# Patient Record
Sex: Female | Born: 1970 | Race: White | Hispanic: No | Marital: Married | Smoking: Never smoker
Health system: Southern US, Community
[De-identification: ages and names within clinical notes are randomized; demographics above are authoritative.]

## PROBLEM LIST (undated history)

## (undated) DIAGNOSIS — I10 Essential (primary) hypertension: Secondary | ICD-10-CM

## (undated) DIAGNOSIS — E669 Obesity, unspecified: Secondary | ICD-10-CM

## (undated) DIAGNOSIS — N926 Irregular menstruation, unspecified: Secondary | ICD-10-CM

## (undated) HISTORY — DX: Irregular menstruation, unspecified: N92.6

## (undated) HISTORY — PX: NO PAST SURGERIES: SHX2092

## (undated) HISTORY — DX: Obesity, unspecified: E66.9

## (undated) HISTORY — PX: WISDOM TOOTH EXTRACTION: SHX21

## (undated) HISTORY — DX: Essential (primary) hypertension: I10

## (undated) HISTORY — PX: OVARIAN CYST REMOVAL: SHX89

---

## 1999-03-31 ENCOUNTER — Other Ambulatory Visit: Admission: RE | Admit: 1999-03-31 | Discharge: 1999-03-31 | Payer: Self-pay | Admitting: *Deleted

## 2014-06-09 ENCOUNTER — Other Ambulatory Visit: Payer: Self-pay | Admitting: Family Medicine

## 2014-06-09 ENCOUNTER — Other Ambulatory Visit (HOSPITAL_COMMUNITY)
Admission: RE | Admit: 2014-06-09 | Discharge: 2014-06-09 | Disposition: A | Discharge status: Home / Self Care | Payer: BC Managed Care – PPO | Source: Ambulatory Visit | Attending: Family Medicine | Admitting: Family Medicine

## 2014-06-09 DIAGNOSIS — Z124 Encounter for screening for malignant neoplasm of cervix: Secondary | ICD-10-CM | POA: Insufficient documentation

## 2014-06-09 DIAGNOSIS — Z1151 Encounter for screening for human papillomavirus (HPV): Secondary | ICD-10-CM | POA: Insufficient documentation

## 2014-06-11 LAB — CYTOLOGY - PAP

## 2015-06-03 ENCOUNTER — Other Ambulatory Visit: Payer: Self-pay

## 2015-06-03 DIAGNOSIS — Z1231 Encounter for screening mammogram for malignant neoplasm of breast: Secondary | ICD-10-CM

## 2015-06-08 ENCOUNTER — Ambulatory Visit: Payer: BC Managed Care – PPO

## 2015-06-11 ENCOUNTER — Ambulatory Visit
Admission: RE | Admit: 2015-06-11 | Discharge: 2015-06-11 | Disposition: A | Discharge status: Home / Self Care | Payer: BC Managed Care – PPO | Source: Ambulatory Visit

## 2015-06-11 DIAGNOSIS — Z1231 Encounter for screening mammogram for malignant neoplasm of breast: Secondary | ICD-10-CM

## 2016-06-07 ENCOUNTER — Other Ambulatory Visit: Payer: Self-pay | Admitting: Family Medicine

## 2016-06-07 DIAGNOSIS — Z1231 Encounter for screening mammogram for malignant neoplasm of breast: Secondary | ICD-10-CM

## 2016-06-09 ENCOUNTER — Ambulatory Visit
Admission: RE | Admit: 2016-06-09 | Discharge: 2016-06-09 | Disposition: A | Discharge status: Home / Self Care | Payer: BC Managed Care – PPO | Source: Ambulatory Visit | Attending: Family Medicine | Admitting: Family Medicine

## 2016-06-09 DIAGNOSIS — Z1231 Encounter for screening mammogram for malignant neoplasm of breast: Secondary | ICD-10-CM

## 2016-06-13 ENCOUNTER — Ambulatory Visit: Payer: BC Managed Care – PPO

## 2017-05-10 ENCOUNTER — Other Ambulatory Visit: Payer: Self-pay | Admitting: Family Medicine

## 2017-05-10 DIAGNOSIS — Z1231 Encounter for screening mammogram for malignant neoplasm of breast: Secondary | ICD-10-CM

## 2017-06-11 ENCOUNTER — Ambulatory Visit: Payer: BC Managed Care – PPO

## 2017-06-18 ENCOUNTER — Ambulatory Visit
Admission: RE | Admit: 2017-06-18 | Discharge: 2017-06-18 | Disposition: A | Discharge status: Home / Self Care | Payer: BC Managed Care – PPO | Source: Ambulatory Visit | Attending: Family Medicine | Admitting: Family Medicine

## 2017-06-18 DIAGNOSIS — Z1231 Encounter for screening mammogram for malignant neoplasm of breast: Secondary | ICD-10-CM

## 2018-05-10 ENCOUNTER — Other Ambulatory Visit: Payer: Self-pay | Admitting: Family Medicine

## 2018-05-10 DIAGNOSIS — Z1231 Encounter for screening mammogram for malignant neoplasm of breast: Secondary | ICD-10-CM

## 2018-06-19 ENCOUNTER — Ambulatory Visit
Admission: RE | Admit: 2018-06-19 | Discharge: 2018-06-19 | Disposition: A | Discharge status: Home / Self Care | Payer: BC Managed Care – PPO | Source: Ambulatory Visit | Attending: Family Medicine | Admitting: Family Medicine

## 2018-06-19 DIAGNOSIS — Z1231 Encounter for screening mammogram for malignant neoplasm of breast: Secondary | ICD-10-CM

## 2019-01-21 ENCOUNTER — Ambulatory Visit (INDEPENDENT_AMBULATORY_CARE_PROVIDER_SITE_OTHER): Payer: BC Managed Care – PPO | Admitting: Physician Assistant

## 2019-01-21 ENCOUNTER — Encounter: Payer: Self-pay | Admitting: Physician Assistant

## 2019-01-21 VITALS — BP 131/89 | HR 89 | Resp 14 | Ht 65.0 in | Wt 244.0 lb

## 2019-01-21 DIAGNOSIS — I10 Essential (primary) hypertension: Secondary | ICD-10-CM

## 2019-01-21 DIAGNOSIS — N970 Female infertility associated with anovulation: Secondary | ICD-10-CM

## 2019-01-21 DIAGNOSIS — Z131 Encounter for screening for diabetes mellitus: Secondary | ICD-10-CM

## 2019-01-21 DIAGNOSIS — Z1329 Encounter for screening for other suspected endocrine disorder: Secondary | ICD-10-CM

## 2019-01-21 DIAGNOSIS — Z6841 Body Mass Index (BMI) 40.0 and over, adult: Secondary | ICD-10-CM | POA: Insufficient documentation

## 2019-01-21 DIAGNOSIS — Z13220 Encounter for screening for lipoid disorders: Secondary | ICD-10-CM | POA: Diagnosis not present

## 2019-01-21 DIAGNOSIS — E66812 Obesity, class 2: Secondary | ICD-10-CM

## 2019-01-21 DIAGNOSIS — N926 Irregular menstruation, unspecified: Secondary | ICD-10-CM

## 2019-01-21 NOTE — Patient Instructions (Addendum)
Request your mediation refills through your pharmacy. Have them contact our office  Due in July for Pap smear  For your blood pressure: - Goal <130/80 (Ideally 120's/70's) - take your blood pressure medication in the morning (unless instructed differently) - monitor and log blood pressures at home - check around the same time each day in a relaxed setting - Limit salt to <2500 mg/day - Follow DASH (Dietary Approach to Stopping Hypertension) eating plan - Try to get at least 150 minutes of aerobic exercise per week - Aim to go on a brisk walk 30 minutes per day at least 5 days per week. If you're not active, gradually increase how long you walk by 5 minutes each week - limit alcohol: 2 standard drinks per day for men and 1 per day for women - avoid tobacco/nicotine products. Consider smoking cessation if you smoke - weight loss: 7% of current body weight can reduce your blood pressure by 5-10 points - follow-up at least every 6 months for your blood pressure. Follow-up sooner if your BP is not controlled   Diet for Polycystic Ovary Syndrome Polycystic ovary syndrome (PCOS) is a disorder of the chemicals (hormones) that regulate a woman's reproductive system, including monthly periods (menstruation). The condition causes important hormones to be out of balance. PCOS can:  Stop your periods or make them irregular.  Cause cysts to develop on your ovaries.  Make it difficult to get pregnant.  Stop your body from responding to the effects of insulin (insulin resistance). Insulin resistance can lead to obesity and diabetes. Changing what you eat can help you manage PCOS and improve your health. Following a balanced diet can help you lose weight and improve the way that your body uses insulin. What are tips for following this plan?  Follow a balanced diet for meals and snacks. Eat breakfast, lunch, dinner, and one or two snacks every day.  Include protein in each meal and snack.  Choose  whole grains instead of products that are made with refined flour.  Eat a variety of foods.  Exercise regularly as told by your health care provider. Aim to do 30 or more minutes of exercise on most days of the week.  If you are overweight or obese: ? Pay attention to how many calories you eat. Cutting down on calories can help you lose weight. ? Work with your health care provider or a diet and nutrition specialist (dietitian) to figure out how many calories you need each day. What foods can I eat?  Fruits Include a variety of colors and types. All fruits are helpful for PCOS. Vegetables Include a variety of colors and types. All vegetables are helpful for PCOS. Grains Whole grains, such as whole wheat. Whole-grain breads, crackers, cereals, and pasta. Unsweetened oatmeal, bulgur, barley, quinoa, and brown rice. Tortillas made from corn or whole-wheat flour. Meats and other proteins Low-fat (lean) proteins, such as fish, chicken, beans, eggs, and tofu. Dairy Low-fat dairy products, such as skim milk, cheese sticks, and yogurt. Beverages Low-fat or fat-free drinks, such as water, low-fat milk, sugar-free drinks, and small amounts of 100% fruit juice. Seasonings and condiments Ketchup. Mustard. Barbecue sauce. Relish. Low-fat or fat-free mayonnaise. Fats and oils Olive oil or canola oil. Walnuts and almonds. The items listed above may not be a complete list of recommended foods and beverages. Contact a dietitian for more options. What foods are not recommended? Foods that are high in calories or fat. Fried foods. Sweets. Products that are made  from refined white flour, including white bread, pastries, white rice, and pasta. The items listed above may not be a complete list of foods and beverages to avoid. Contact a dietitian for more information. Summary  PCOS is a hormonal imbalance that affects a woman's reproductive system.  You can help to manage your PCOS by exercising  regularly and eating a healthy, varied diet of vegetables, fruit, whole grains, low-fat (lean) protein, and low-fat dairy products.  Changing what you eat can improve the way that your body uses insulin, help your hormones reach normal levels, and help you lose weight. This information is not intended to replace advice given to you by your health care provider. Make sure you discuss any questions you have with your health care provider. Document Released: 03/13/2016 Document Revised: 09/24/2017 Document Reviewed: 09/24/2017 Elsevier Interactive Patient Education  2019 [employer]     Dysfunctional Uterine Bleeding Dysfunctional uterine bleeding is abnormal bleeding from the uterus. Dysfunctional uterine bleeding includes:  A menstrual period that comes earlier or later than usual.  A menstrual period that is lighter or heavier than usual, or has large blood clots.  Vaginal bleeding between menstrual periods.  Skipping one or more menstrual periods.  Vaginal bleeding after sex.  Vaginal bleeding after menopause. Follow these instructions at home: Eating and drinking   Eat well-balanced meals. Include foods that are high in iron, such as liver, meat, shellfish, green leafy vegetables, and eggs.  To prevent or treat constipation, your health care provider may recommend that you: ? Drink enough fluid to keep your urine pale yellow. ? Take over-the-counter or prescription medicines. ? Eat foods that are high in fiber, such as beans, whole grains, and fresh fruits and vegetables. ? Limit foods that are high in fat and processed sugars, such as fried or sweet foods. Medicines  Take over-the-counter and prescription medicines only as told by your health care provider.  Do not change medicines without talking with your health care provider.  Aspirin or medicines that contain aspirin may make the bleeding worse. Do not take those medicines: ? During the week before your menstrual  period. ? During your menstrual period.  If you were prescribed iron pills, take them as told by your health care provider. Iron pills help to replace iron that your body loses because of this condition. Activity  If you need to change your sanitary pad or tampon more than one time every 2 hours: ? Lie in bed with your feet raised (elevated). ? Place a cold pack on your lower abdomen. ? Rest as much as possible until the bleeding stops or slows down.  Do not try to lose weight until the bleeding has stopped and your blood iron level is back to normal. General instructions   For two months, write down: ? When your menstrual period starts. ? When your menstrual period ends. ? When any abnormal vaginal bleeding occurs. ? What problems you notice.  Keep all follow up visits as told by your health care provider. This is important. Contact a health care provider if you:  Feel light-headed or weak.  Have nausea and vomiting.  Cannot eat or drink without vomiting.  Feel dizzy or have diarrhea while you are taking medicines.  Are taking birth control pills or hormones, and you want to change them or stop taking them. Get help right away if:  You develop a fever or chills.  You need to change your sanitary pad or tampon more than one time  per hour.  Your vaginal bleeding becomes heavier, or your flow contains clots more often.  You develop pain in your abdomen.  You lose consciousness.  You develop a rash. Summary  Dysfunctional uterine bleeding is abnormal bleeding from the uterus.  It includes menstrual bleeding of abnormal duration, volume, or regularity.  Bleeding after sex and after menopause are also considered dysfunctional uterine bleeding. This information is not intended to replace advice given to you by your health care provider. Make sure you discuss any questions you have with your health care provider. Document Released: 11/17/2000 Document Revised:  05/01/2018 Document Reviewed: 05/01/2018 Elsevier Interactive Patient Education  2019 [employer].

## 2019-01-21 NOTE — Progress Notes (Signed)
HPI:                                                                Alison Fuentes is a 48 y.o. female who presents to [facility]: [facility] today to establish care  HTN: taking Losartan 50 mg daily. Compliant with medications. Does not check BP's at home. Denies vision change, headache, chest pain with exertion, orthopnea, lightheadedness, syncope and edema. Risk factors include: obesity, family hx  Menses: reports she goes months without a menstrual cycle. LMP was 01/17/19 but prior to this LMP was August 2019. She states this began in her 36's. Cycles last for 1-2 days. No dysmenorrhea or menorrhagia. She is not sexually active. Denies hirsutism.  Depression screen [facility] 2/9 01/21/2019  Decreased Interest 0  Down, Depressed, Hopeless 0  PHQ - 2 Score 0    No flowsheet data found.    Past Medical History:  Diagnosis Date  . Hypertension   . Irregular menses   . Obesity    Past Surgical History:  Procedure Laterality Date  . NO PAST SURGERIES     Social History   Tobacco Use  . Smoking status: Never Smoker  . Smokeless tobacco: Never Used  Substance Use Topics  . Alcohol use: Not Currently    Comment: very rarely   family history includes Atrial fibrillation in her father; Fibromyalgia in her mother; Heart attack in her paternal uncle; Heart disease in her maternal grandmother; Hyperlipidemia in her maternal aunt and mother.    ROS: negative except as noted in the HPI  Medications: Current Outpatient Medications  Medication Sig Dispense Refill  . losartan (COZAAR) 50 MG tablet      No current facility-administered medications for this visit.    Allergies  Allergen Reactions  . Codeine Nausea And Vomiting       Objective:  BP 131/89   Pulse 89   Resp 14   Ht 5' 5"  (1.651 m)   Wt 244 lb (110.7 kg)   LMP 01/17/2019   BMI 40.60 kg/m  Gen:  alert, not ill-appearing, no distress, appropriate for age, obese  female HEENT: head normocephalic without obvious abnormality, conjunctiva and cornea clear, trachea midline Pulm: Normal work of breathing, normal phonation, poor air movement and poor effort, no wheezes, rales or rhonchi CV: Normal rate, regular rhythm, s1 and s2 distinct, no murmurs, clicks or rubs  Neuro: alert and oriented x 3, no tremor MSK: extremities atraumatic, normal gait and station, no peripheral edema Skin: intact, facial rosacea present Psych: well-groomed, cooperative, good eye contact, euthymic mood, affect mood-congruent, speech is articulate, and thought processes clear and goal-directed  No results found for: CREATININE, BUN, NA, K, CL, CO2 No results found for: ALT, AST, GGT, ALKPHOS, BILITOT    No results found for this or any previous visit (from the past 72 hour(s)). No results found.    Assessment and Plan: 48 y.o. female with   .Natiya was seen today for establish care.  Diagnoses and all orders for this visit:  Hypertension goal BP (blood pressure) < 130/80 -     COMPLETE METABOLIC PANEL WITH GFR -     Hemoglobin A1c -     Lipid Panel w/reflex Direct LDL -  TSH + free T4  Anovulatory (dysfunctional uterine) bleeding -     CBC -     TSH + free T4 -     FSH/LH -     Testosterone , Free and Total -     Prolactin -     Estradiol -     US PELVIC COMPLETE WITH TRANSVAGINAL; Future  Class 2 severe obesity due to excess calories with serious comorbidity in adult, unspecified BMI (HCC) -     Hemoglobin A1c -     Lipid Panel w/reflex Direct LDL  Screening for lipid disorders -     Lipid Panel w/reflex Direct LDL  Irregular menses -     CBC -     TSH + free T4 -     FSH/LH -     Testosterone , Free and Total -     Prolactin -     Estradiol -     US PELVIC COMPLETE WITH TRANSVAGINAL; Future  Screening for thyroid disorder -     TSH + free T4  Screening for diabetes mellitus -     Hemoglobin A1c   - Personally reviewed PMH, PSH, PFH,  medications, allergies, HM - Age-appropriate cancer screening: Pap UTD, NILM, HPV neg, due 06/2019; mammogram UTD due 06/2019 - Influenza UTD - Tdap declined - PHQ2 negative  HTN BP out of range. Patient reports missing her dose of Losartan today Counseled on therapeutic lifestyle changes including weight loss Renal function pending  AUB Hx consistent with anovulatory bleeding. DDx includes PCOS, thyroid disease, prolactinoma/empty sella, Cushings, premature ovarian failure FSH, LH, estradiol, testosterone, prolactin and TSH pending Pelv/TV ordered to assess ovaries  Patient education and anticipatory guidance given Patient agrees with treatment plan Follow-up pending lab and imaging results  Darlyne Russian PA-C

## 2019-01-23 ENCOUNTER — Encounter: Payer: Self-pay | Admitting: Physician Assistant

## 2019-01-23 DIAGNOSIS — Z780 Asymptomatic menopausal state: Secondary | ICD-10-CM | POA: Insufficient documentation

## 2019-01-24 LAB — LIPID PANEL W/REFLEX DIRECT LDL
Cholesterol: 187 mg/dL (ref <200)
HDL: 39 mg/dL — ABNORMAL LOW (ref >=50)
LDL Cholesterol (Calc): 128 mg/dL (calc) — ABNORMAL HIGH
Non-HDL Cholesterol (Calc): 148 mg/dL (calc) — ABNORMAL HIGH (ref <130)
TRIGLYCERIDES: 102 mg/dL (ref <150)
Total CHOL/HDL Ratio: 4.8 (calc) (ref <5.0)

## 2019-01-24 LAB — CBC
HCT: 41.9 % (ref 35.0–45.0)
Hemoglobin: 14.3 g/dL (ref 11.7–15.5)
MCH: 30 pg (ref 27.0–33.0)
MCHC: 34.1 g/dL (ref 32.0–36.0)
MCV: 87.8 fL (ref 80.0–100.0)
MPV: 10.8 fL (ref 7.5–12.5)
PLATELETS: 265 10*3/uL (ref 140–400)
RBC: 4.77 10*6/uL (ref 3.80–5.10)
RDW: 13.2 % (ref 11.0–15.0)
WBC: 6.9 10*3/uL (ref 3.8–10.8)

## 2019-01-24 LAB — HEMOGLOBIN A1C
EAG (MMOL/L): 5.4 (calc)
Hgb A1c MFr Bld: 5 % of total Hgb (ref <5.7)
MEAN PLASMA GLUCOSE: 97 (calc)

## 2019-01-24 LAB — COMPLETE METABOLIC PANEL WITH GFR
AG RATIO: 1.5 (calc) (ref 1.0–2.5)
ALT: 15 U/L (ref 6–29)
AST: 20 U/L (ref 10–35)
Albumin: 4 g/dL (ref 3.6–5.1)
Alkaline phosphatase (APISO): 67 U/L (ref 31–125)
BUN: 12 mg/dL (ref 7–25)
CALCIUM: 9.2 mg/dL (ref 8.6–10.2)
CHLORIDE: 107 mmol/L (ref 98–110)
CO2: 24 mmol/L (ref 20–32)
Creat: 0.57 mg/dL (ref 0.50–1.10)
GFR, Est African American: 126 mL/min/{1.73_m2} (ref >=60)
GFR, Est Non African American: 109 mL/min/{1.73_m2} (ref >=60)
Globulin: 2.7 g/dL (calc) (ref 1.9–3.7)
Glucose, Bld: 76 mg/dL (ref 65–139)
POTASSIUM: 4.1 mmol/L (ref 3.5–5.3)
Sodium: 140 mmol/L (ref 135–146)
Total Bilirubin: 0.5 mg/dL (ref 0.2–1.2)
Total Protein: 6.7 g/dL (ref 6.1–8.1)

## 2019-01-24 LAB — TESTOSTERONE, FREE & TOTAL
Free Testosterone: 2.5 pg/mL (ref 0.1–6.4)
Testosterone, Total, LC-MS-MS: 28 ng/dL (ref 2–45)

## 2019-01-24 LAB — FSH/LH
FSH: 39.7 m[IU]/mL
LH: 11.3 m[IU]/mL

## 2019-01-24 LAB — PROLACTIN: PROLACTIN: 4.7 ng/mL

## 2019-01-24 LAB — ESTRADIOL: ESTRADIOL: 28 pg/mL

## 2019-01-24 LAB — TSH+FREE T4: TSH W/REFLEX TO FT4: 2.26 m[IU]/L

## 2019-01-28 ENCOUNTER — Other Ambulatory Visit: Payer: BC Managed Care – PPO

## 2019-02-05 ENCOUNTER — Ambulatory Visit (INDEPENDENT_AMBULATORY_CARE_PROVIDER_SITE_OTHER): Payer: BC Managed Care – PPO

## 2019-02-05 DIAGNOSIS — D252 Subserosal leiomyoma of uterus: Secondary | ICD-10-CM | POA: Diagnosis not present

## 2019-02-05 DIAGNOSIS — N970 Female infertility associated with anovulation: Secondary | ICD-10-CM

## 2019-02-05 DIAGNOSIS — N926 Irregular menstruation, unspecified: Secondary | ICD-10-CM

## 2019-02-06 ENCOUNTER — Other Ambulatory Visit: Payer: Self-pay | Admitting: Physician Assistant

## 2019-02-06 DIAGNOSIS — D259 Leiomyoma of uterus, unspecified: Secondary | ICD-10-CM

## 2019-02-27 ENCOUNTER — Encounter: Payer: BC Managed Care – PPO | Admitting: Obstetrics and Gynecology

## 2019-05-26 ENCOUNTER — Other Ambulatory Visit: Payer: Self-pay | Admitting: Physician Assistant

## 2019-05-26 DIAGNOSIS — Z1231 Encounter for screening mammogram for malignant neoplasm of breast: Secondary | ICD-10-CM

## 2019-06-05 ENCOUNTER — Encounter: Payer: Self-pay | Admitting: Family Medicine

## 2019-06-05 ENCOUNTER — Ambulatory Visit: Payer: BC Managed Care – PPO | Admitting: Family Medicine

## 2019-06-05 ENCOUNTER — Other Ambulatory Visit: Payer: Self-pay

## 2019-06-05 DIAGNOSIS — D252 Subserosal leiomyoma of uterus: Secondary | ICD-10-CM | POA: Diagnosis not present

## 2019-06-05 DIAGNOSIS — D259 Leiomyoma of uterus, unspecified: Secondary | ICD-10-CM | POA: Insufficient documentation

## 2019-06-05 DIAGNOSIS — Z780 Asymptomatic menopausal state: Secondary | ICD-10-CM

## 2019-06-05 NOTE — Assessment & Plan Note (Addendum)
Reviewed with patient. Irregular cycles related to this. She is w/o hot flashes or other issues surrounding climacteric. Definition of 1 year without cycle reviewed.

## 2019-06-05 NOTE — Patient Instructions (Signed)
Menopause Menopause is the normal time of life when menstrual periods stop completely. It is usually confirmed by 12 months without a menstrual period. The transition to menopause (perimenopause) most often happens between the ages of 45 and 55. During perimenopause, hormone levels change in your body, which can cause symptoms and affect your health. Menopause may increase your risk for:  Loss of bone (osteoporosis), which causes bone breaks (fractures).  Depression.  Hardening and narrowing of the arteries (atherosclerosis), which can cause heart attacks and strokes. What are the causes? This condition is usually caused by a natural change in hormone levels that happens as you get older. The condition may also be caused by surgery to remove both ovaries (bilateral oophorectomy). What increases the risk? This condition is more likely to start at an earlier age if you have certain medical conditions or treatments, including:  A tumor of the pituitary gland in the brain.  A disease that affects the ovaries and hormone production.  Radiation treatment for cancer.  Certain cancer treatments, such as chemotherapy or hormone (anti-estrogen) therapy.  Heavy smoking and excessive alcohol use.  Family history of early menopause. This condition is also more likely to develop earlier in women who are very thin. What are the signs or symptoms? Symptoms of this condition include:  Hot flashes.  Irregular menstrual periods.  Night sweats.  Changes in feelings about sex. This could be a decrease in sex drive or an increased comfort around your sexuality.  Vaginal dryness and thinning of the vaginal walls. This may cause painful intercourse.  Dryness of the skin and development of wrinkles.  Headaches.  Problems sleeping (insomnia).  Mood swings or irritability.  Memory problems.  Weight gain.  Hair growth on the face and chest.  Bladder infections or problems with urinating. How  is this diagnosed? This condition is diagnosed based on your medical history, a physical exam, your age, your menstrual history, and your symptoms. Hormone tests may also be done. How is this treated? In some cases, no treatment is needed. You and your health care provider should make a decision together about whether treatment is necessary. Treatment will be based on your individual condition and preferences. Treatment for this condition focuses on managing symptoms. Treatment may include:  Menopausal hormone therapy (MHT).  Medicines to treat specific symptoms or complications.  Acupuncture.  Vitamin or herbal supplements. Before starting treatment, make sure to let your health care provider know if you have a personal or family history of:  Heart disease.  Breast cancer.  Blood clots.  Diabetes.  Osteoporosis. Follow these instructions at home: Lifestyle  Do not use any products that contain nicotine or tobacco, such as cigarettes and e-cigarettes. If you need help quitting, ask your health care provider.  Get at least 30 minutes of physical activity on 5 or more days each week.  Avoid alcoholic and caffeinated beverages, as well as spicy foods. This may help prevent hot flashes.  Get 7-8 hours of sleep each night.  If you have hot flashes, try: ? Dressing in layers. ? Avoiding things that may trigger hot flashes, such as spicy food, warm places, or stress. ? Taking slow, deep breaths when a hot flash starts. ? Keeping a fan in your home and office.  Find ways to manage stress, such as deep breathing, meditation, or journaling.  Consider going to group therapy with other women who are having menopause symptoms. Ask your health care provider about recommended group therapy meetings. Eating and   drinking  Eat a healthy, balanced diet that contains whole grains, lean protein, low-fat dairy, and plenty of fruits and vegetables.  Your health care provider may recommend  adding more soy to your diet. Foods that contain soy include tofu, tempeh, and soy milk.  Eat plenty of foods that contain calcium and vitamin D for bone health. Items that are rich in calcium include low-fat milk, yogurt, beans, almonds, sardines, broccoli, and kale. Medicines  Take over-the-counter and prescription medicines only as told by your health care provider.  Talk with your health care provider before starting any herbal supplements. If prescribed, take vitamins and supplements as told by your health care provider. These may include: ? Calcium. Women age 51 and older should get 1,200 mg (milligrams) of calcium every day. ? Vitamin D. Women need 600-800 International Units of vitamin D each day. ? Vitamins B12 and B6. Aim for 50 micrograms of B12 and 1.5 mg of B6 each day. General instructions  Keep track of your menstrual periods, including: ? When they occur. ? How heavy they are and how long they last. ? How much time passes between periods.  Keep track of your symptoms, noting when they start, how often you have them, and how long they last.  Use vaginal lubricants or moisturizers to help with vaginal dryness and improve comfort during sex.  Keep all follow-up visits as told by your health care provider. This is important. This includes any group therapy or counseling. Contact a health care provider if:  You are still having menstrual periods after age 55.  You have pain during sex.  You have not had a period for 12 months and you develop vaginal bleeding. Get help right away if:  You have: ? Severe depression. ? Excessive vaginal bleeding. ? Pain when you urinate. ? A fast or irregular heart beat (palpitations). ? Severe headaches. ? Abdomen (abdominal) pain or severe indigestion.  You fell and you think you have a broken bone.  You develop leg or chest pain.  You develop vision problems.  You feel a lump in your breast. Summary  Menopause is the normal  time of life when menstrual periods stop completely. It is usually confirmed by 12 months without a menstrual period.  The transition to menopause (perimenopause) most often happens between the ages of 45 and 55.  Symptoms can be managed through medicines, lifestyle changes, and complementary therapies such as acupuncture.  Eat a balanced diet that is rich in nutrients to promote bone health and heart health and to manage symptoms during menopause. This information is not intended to replace advice given to you by your health care provider. Make sure you discuss any questions you have with your health care provider. Document Released: 02/10/2004 Document Revised: 11/02/2017 Document Reviewed: 12/23/2016 Elsevier Patient Education  2020 Elsevier Inc.  

## 2019-06-05 NOTE — Assessment & Plan Note (Signed)
Given location and lack of symptoms, should continue to resorb during menopause as these are mostly hormonally responsive. Images reviewed with patient and detailed information given.

## 2019-06-05 NOTE — Progress Notes (Signed)
   Subjective:    Patient ID: Alison Fuentes is a 48 y.o. female presenting with Fibroids  on 06/05/2019  HPI: New referral today for fibroids. Seen in PCP office and noted to have irregular cycles. W/u included labs and u/s. She is here for f/u. Reports cycles are irregular. Coming every few months. Prior to this were monthly until mid-forties. Denies hot flashes. Works at [employer]. She is a G0. Denies pelvic pain or heavy menstrual bleeding. LMP was in may of 2020. Reports normal pap smears with [facility]--need records.  Review of Systems  Constitutional: Negative for chills and fever.  Respiratory: Negative for shortness of breath.   Cardiovascular: Negative for chest pain.  Gastrointestinal: Negative for abdominal pain, nausea and vomiting.  Genitourinary: Negative for dysuria.  Skin: Negative for rash.      Objective:    Resp 16   Ht 5' 5"  (1.651 m)   BMI 40.60 kg/m  Physical Exam Constitutional:      General: She is not in acute distress.    Appearance: She is well-developed.  HENT:     Head: Normocephalic and atraumatic.  Eyes:     General: No scleral icterus. Neck:     Musculoskeletal: Neck supple.  Cardiovascular:     Rate and Rhythm: Normal rate.  Pulmonary:     Effort: Pulmonary effort is normal.  Abdominal:     Palpations: Abdomen is soft.  Skin:    General: Skin is warm and dry.  Neurological:     Mental Status: She is alert and oriented to person, place, and time.    FSH is 39.7, LH is 11.3, nml TSH U/s shows 8 cm uterus with small 2 cm subserosal fibroid. Endometrial stripe is 2 cm. Ovaries are not seen      Assessment & Plan:   Problem List Items Addressed This Visit      Unprioritized   Postmenopausal    Reviewed with patient. Irregular cycles related to this. She is w/o hot flashes or other issues surrounding climacteric. Definition of 1 year without cycle reviewed.      Fibroid uterus    Given location and lack of symptoms, should  continue to resorb during menopause as these are mostly hormonally responsive. Images reviewed with patient and detailed information given.         Total face-to-face time with patient: 20 minutes. Over 50% of encounter was spent on counseling and coordination of care. Return if symptoms worsen or fail to improve or when due for pap.  Donnamae Jude 06/05/2019 1:14 PM

## 2019-06-24 ENCOUNTER — Encounter: Payer: BC Managed Care – PPO | Admitting: Physician Assistant

## 2019-07-01 ENCOUNTER — Other Ambulatory Visit: Payer: Self-pay

## 2019-07-01 ENCOUNTER — Ambulatory Visit (INDEPENDENT_AMBULATORY_CARE_PROVIDER_SITE_OTHER): Payer: BC Managed Care – PPO | Admitting: Physician Assistant

## 2019-07-01 ENCOUNTER — Other Ambulatory Visit (HOSPITAL_COMMUNITY)
Admission: RE | Admit: 2019-07-01 | Discharge: 2019-07-01 | Disposition: A | Discharge status: Home / Self Care | Payer: BC Managed Care – PPO | Source: Ambulatory Visit | Attending: Physician Assistant | Admitting: Physician Assistant

## 2019-07-01 ENCOUNTER — Encounter: Payer: Self-pay | Admitting: Physician Assistant

## 2019-07-01 VITALS — BP 128/87 | HR 84 | Temp 98.0°F | Wt 252.0 lb

## 2019-07-01 DIAGNOSIS — N898 Other specified noninflammatory disorders of vagina: Secondary | ICD-10-CM | POA: Insufficient documentation

## 2019-07-01 DIAGNOSIS — Z1211 Encounter for screening for malignant neoplasm of colon: Secondary | ICD-10-CM

## 2019-07-01 DIAGNOSIS — I10 Essential (primary) hypertension: Secondary | ICD-10-CM

## 2019-07-01 DIAGNOSIS — Z124 Encounter for screening for malignant neoplasm of cervix: Secondary | ICD-10-CM | POA: Insufficient documentation

## 2019-07-01 DIAGNOSIS — N939 Abnormal uterine and vaginal bleeding, unspecified: Secondary | ICD-10-CM

## 2019-07-01 DIAGNOSIS — N952 Postmenopausal atrophic vaginitis: Secondary | ICD-10-CM | POA: Diagnosis not present

## 2019-07-01 DIAGNOSIS — Z6841 Body Mass Index (BMI) 40.0 and over, adult: Secondary | ICD-10-CM

## 2019-07-01 DIAGNOSIS — Z0000 Encounter for general adult medical examination without abnormal findings: Secondary | ICD-10-CM | POA: Diagnosis not present

## 2019-07-01 DIAGNOSIS — H6122 Impacted cerumen, left ear: Secondary | ICD-10-CM | POA: Insufficient documentation

## 2019-07-01 MED ORDER — LOSARTAN POTASSIUM 100 MG PO TABS: 100.0000 mg | ORAL_TABLET | Freq: Every day | ORAL | 1 refills | Status: DC

## 2019-07-01 NOTE — Progress Notes (Signed)
HPI:                                                                Alison Fuentes is a 48 y.o. female who presents to [facility] today for annual physical exam    GYN/Sexual Health  Obstetrics: G0P0  Menstrual status: perimenopausal, irregular periods  LMP: march 2020  Menses: irregular  Last pap smear: unknown  History of abnormal pap smears: no  Sexually active: not currently  Current contraception: abstinence  History of STI: no  Depression screen [facility] 2/9 07/01/2019 01/21/2019  Decreased Interest 0 0  Down, Depressed, Hopeless 0 0  PHQ - 2 Score 0 0    Health Maintenance Health Maintenance  Topic Date Due  . PAP SMEAR-Modifier  06/10/2019  . TETANUS/TDAP  01/22/2020 (Originally 11/04/1988)  . INFLUENZA VACCINE  07/05/2019    Past Medical History:  Diagnosis Date  . Hypertension   . Irregular menses   . Obesity    Past Surgical History:  Procedure Laterality Date  . NO PAST SURGERIES    . OVARIAN CYST REMOVAL     Social History   Tobacco Use  . Smoking status: Never Smoker  . Smokeless tobacco: Never Used  Substance Use Topics  . Alcohol use: Not Currently    Comment: very rarely   family history includes Atrial fibrillation in her father; Fibromyalgia in her mother; Heart disease in her maternal grandmother and maternal uncle; Hyperlipidemia in her maternal aunt and mother.  ROS: negative except as noted in the HPI  Medications: Current Outpatient Medications  Medication Sig Dispense Refill  . losartan (COZAAR) 100 MG tablet Take 1 tablet (100 mg total) by mouth daily. 90 tablet 1   No current facility-administered medications for this visit.    Allergies  Allergen Reactions  . Codeine Nausea And Vomiting       Objective:  BP 128/87   Pulse 84   Temp 98 F (36.7 C) (Oral)   Wt 252 lb (114.3 kg)   LMP 02/09/2019 (Approximate)   BMI 41.93 kg/m   Vitals:   07/01/19 0955 07/01/19  1037  BP: (!) 139/92 128/87  Pulse: 84   Temp: 98 F (36.7 C)     Wt Readings from Last 3 Encounters:  07/01/19 252 lb (114.3 kg)  06/05/19 254 lb (115.2 kg)  01/21/19 244 lb (110.7 kg)   Temp Readings from Last 3 Encounters:  07/01/19 98 F (36.7 C) (Oral)   BP Readings from Last 3 Encounters:  07/01/19 128/87  06/05/19 140/86  01/21/19 131/89   Pulse Readings from Last 3 Encounters:  07/01/19 84  06/05/19 76  01/21/19 89    General Appearance:  Alert, cooperative, no distress, appropriate for age, obese female                            Head:  Normocephalic, without obvious abnormality                             Eyes:  PERRL, EOM's intact, conjunctiva and cornea clear, wearing glasses  Ears:  TM pearly gray color and semitransparent, external ear canals normal, both ears                            Nose:  Nares symmetrical, mucosa pink                          Throat:  Lips, tongue, and mucosa are moist, pink, and intact; oropharynx clear, uvula midline; good dentition                             Neck:  Supple; symmetrical, trachea midline, no adenopathy; thyroid: no enlargement, symmetric, no tenderness/mass/nodules                             Back:  Symmetrical, no curvature, ROM normal               Chest/Breast:  deferred                           Lungs:  Clear to auscultation bilaterally, respirations unlabored                             Heart:  normal rate & regular rhythm, S1 and S2 normal, no murmurs, rubs, or gallops                     Abdomen:  Soft, non-tender, no mass, exam limited due to body habitus              Genitourinary:   GU: vulva without rashes or lesions, normal introitus and urethral meatus, vaginal mucosa is pale, there is a non-friable, red lesion of the anterior wall of the vagina about 1/3 of the way into the canal, normal discharge, cervix is incompletely visualized due to discomfort with speculum exam, visualized  portion is non-friable without lesions         Musculoskeletal:  Tone and strength strong and symmetrical, all extremities; no joint pain or edema, normal gait and station                                  Lymphatic:  No adenopathy             Skin/Hair/Nails:  Skin warm, dry and intact, no rashes or abnormal dyspigmentation on limited exam                   Neurologic:  Alert and oriented x3, no cranial nerve deficits, DTR's intact, sensation grossly intact, normal gait and station, no tremor Psych: well-groomed, cooperative, good eye contact, euthymic mood, affect mood-congruent, speech is articulate, and thought processes clear and goal-directed    A chaperone was present for the GU portion of the exam, Izell [place], RMA.  Lab Results  Component Value Date   CREATININE 0.57 01/21/2019   BUN 12 01/21/2019   NA 140 01/21/2019   K 4.1 01/21/2019   CL 107 01/21/2019   CO2 24 01/21/2019   Lab Results  Component Value Date   ALT 15 01/21/2019   AST 20 01/21/2019   BILITOT 0.5 01/21/2019   Lab Results  Component Value Date  WBC 6.9 01/21/2019   HGB 14.3 01/21/2019   HCT 41.9 01/21/2019   MCV 87.8 01/21/2019   PLT 265 01/21/2019   No results found for: TSH, T3TOTAL, T4TOTAL, THYROIDAB Lab Results  Component Value Date   HGBA1C 5.0 01/21/2019   Lab Results  Component Value Date   CHOL 187 01/21/2019   HDL 39 (L) 01/21/2019   LDLCALC 128 (H) 01/21/2019   TRIG 102 01/21/2019   CHOLHDL 4.8 01/21/2019   The 10-year ASCVD risk score Mikey Bussing DC Jr., et al., 2013) is: 2.2%   Values used to calculate the score:     Age: 32 years     Sex: Female     Is Non-Hispanic African American: No     Diabetic: No     Tobacco smoker: No     Systolic Blood Pressure: 403 mmHg     Is BP treated: Yes     HDL Cholesterol: 39 mg/dL     Total Cholesterol: 187 mg/dL   No results found for this or any previous visit (from the past 72 hour(s)). No results found.    Assessment and Plan: 48  y.o. female with   .[name] was seen today for annual exam.  Diagnoses and all orders for this visit:  Encounter for annual physical exam  Postmenopausal atrophic vaginitis -     Ambulatory referral to Obstetrics / Gynecology  Encounter for Papanicolaou smear of cervix with human papilloma virus (HPV) DNA cotesting -     Cytology - PAP  Vaginal lesion -     Ambulatory referral to Obstetrics / Gynecology  Abnormal uterine bleeding (AUB)  Ceruminosis, left  Colon cancer screening -     Cologuard  Class 3 severe obesity due to excess calories with serious comorbidity and body mass index (BMI) of 40.0 to 44.9 in adult [facility])  Hypertension goal BP (blood pressure) < 130/80 -     losartan (COZAAR) 100 MG tablet; Take 1 tablet (100 mg total) by mouth daily.   - Personally reviewed PMH, PSH, PFH, medications, allergies, HM - Age-appropriate cancer screening: Pap UTD, mammo scheduled; She will turn 48 in 5 months, Cologuard ordered. Counseled to check coverage wit her insurance - Tdap declined - PHQ2 negative - Fasting labs completed less than 6 months ago - BMI 41: Counseled on weight loss to decrease caloric intake and increased aerobic exercise  Vaginal lesion -recommended close follow-up with gynecology.  She is already established with Dr. Kennon Rounds and was instructed to schedule an appointment with her to rule out vaginal cancer.  Hypertension Blood pressure out of range in office today on 2 checks Increasing losartan to 100 mg daily 10-year ASCVD risk 2%, not a candidate for baby aspirin or statin Counseled on therapeutic lifestyle changes  Patient education and anticipatory guidance given Patient agrees with treatment plan Follow-up in 6 months for HTN or sooner as needed  Darlyne Russian PA-C

## 2019-07-01 NOTE — Patient Instructions (Addendum)
Follow-up with Dr. Kennon Rounds (Gynecology) for spot on front wall of vagina - this may be due to vaginal atrophy from hormone changes, but it is important to rule out vaginal cancers  Colorectal Cancer Screening  Colorectal cancer screening is a group of tests that are used to check for colorectal cancer before symptoms develop. Colorectal refers to the colon and rectum. The colon and rectum are located at the end of the digestive tract and carry bowel movements out of the body. Who should have screening? All adults starting at age 85 until age 64 should have screening. Your health care provider may recommend screening at age 49. You will have tests every 1-10 years, depending on your results and the type of screening test. You may have screening tests starting at an earlier age, or more frequently than other people, if you have any of the following risk factors:  A personal or family history of colorectal cancer or abnormal growths (polyps).  Inflammatory bowel disease, such as ulcerative colitis or Crohn's disease.  A history of having radiation treatment to the abdomen or pelvic area for cancer.  Colorectal cancer symptoms, such as changes in bowel habits or blood in your stool.  A type of colon cancer syndrome that is passed from parent to child (hereditary), such as: ? Lynch syndrome. ? Familial adenomatous polyposis. ? Turcot syndrome. ? Peutz-Jeghers syndrome. Screening recommendations for adults who are 12-90 years old vary depending on health. How is screening done? There are several types of colorectal screening tests. You may have one or more of the following:  Guaiac-based fecal occult blood testing. For this test, a stool (feces) sample is checked for hidden (occult) blood, which could be a sign of colorectal cancer.  Fecal immunochemical test (FIT). For this test, a stool sample is checked for blood, which could be a sign of colorectal cancer.  Stool DNA test. For this test, a  stool sample is checked for blood and changes in DNA that could lead to colorectal cancer.  Sigmoidoscopy. During this test, a thin, flexible tube with a camera on the end (sigmoidoscope) is used to examine the rectum and the lower colon.  Colonoscopy. During this test, a long, flexible tube with a camera on the end (colonoscope) is used to examine the entire colon and rectum. With a colonoscopy, it is possible to take a sample of tissue (biopsy) and remove small polyps during the test.  Virtual colonoscopy. Instead of a colonoscope, this type of colonoscopy uses X-rays (CT scan) and computers to produce images of the colon and rectum. What are the benefits of screening? Screening reduces your risk for colorectal cancer and can help identify cancer at an early stage, when the cancer can be removed or treated more easily. It is common for polyps to form in the lining of the colon, especially as you age. These polyps may be cancerous or become cancerous over time. Screening can identify these polyps. What are the risks of screening? Each screening test may have different risks.  Stool sample tests have fewer risks than other types of screening tests. However, you may need more tests to confirm results from a stool sample test.  Screening tests that involve X-rays expose you to low levels of radiation, which may slightly increase your cancer risk. The benefit of detecting cancer outweighs the slight increase in risk.  Screening tests such as sigmoidoscopy and colonoscopy may place you at risk for bleeding, intestinal damage, infection, or a reaction to medicines given  during the exam. Talk with your health care provider to understand your risk for colorectal cancer and to make a screening plan that is right for you. Questions to ask your health care provider  When should I start colorectal cancer screening?  What is my risk for colorectal cancer?  How often do I need screening?  Which  screening tests do I need?  How do I get my test results?  What do my results mean? Where to find more information Learn more about colorectal cancer screening from:  The [place]: www.cancer.org  The [employer]: www.cancer.gov Summary  Colorectal cancer screening is a group of tests used to check for colorectal cancer before symptoms develop.  Screening reduces your risk for colorectal cancer and can help identify cancer at an early stage, when the cancer can be removed or treated more easily.  All adults starting at age 37 until age 52 should have screening. Your health care provider may recommend screening at age 35.  You may have screening tests starting at an earlier age, or more frequently than other people, if you have certain risk factors.  Talk with your health care provider to understand your risk for colorectal cancer and to make a screening plan that is right for you. This information is not intended to replace advice given to you by your health care provider. Make sure you discuss any questions you have with your health care provider. Document Released: 05/10/2010 Document Revised: 03/12/2019 Document Reviewed: 08/22/2017 Elsevier Patient Education  2020 Elsevier Inc.   Preventive Care 20-57 Years Old, Female Preventive care refers to visits with your health care provider and lifestyle choices that can promote health and wellness. This includes:  A yearly physical exam. This may also be called an annual well check.  Regular dental visits and eye exams.  Immunizations.  Screening for certain conditions.  Healthy lifestyle choices, such as eating a healthy diet, getting regular exercise, not using drugs or products that contain nicotine and tobacco, and limiting alcohol use. What can I expect for my preventive care visit? Physical exam Your health care provider will check your:  Height and weight. This may be used to calculate body  mass index (BMI), which tells if you are at a healthy weight.  Heart rate and blood pressure.  Skin for abnormal spots. Counseling Your health care provider may ask you questions about your:  Alcohol, tobacco, and drug use.  Emotional well-being.  Home and relationship well-being.  Sexual activity.  Eating habits.  Work and work Statistician.  Method of birth control.  Menstrual cycle.  Pregnancy history. What immunizations do I need?  Influenza (flu) vaccine  This is recommended every year. Tetanus, diphtheria, and pertussis (Tdap) vaccine  You may need a Td booster every 10 years. Varicella (chickenpox) vaccine  You may need this if you have not been vaccinated. Zoster (shingles) vaccine  You may need this after age 53. Measles, mumps, and rubella (MMR) vaccine  You may need at least one dose of MMR if you were born in 1957 or later. You may also need a second dose. Pneumococcal conjugate (PCV13) vaccine  You may need this if you have certain conditions and were not previously vaccinated. Pneumococcal polysaccharide (PPSV23) vaccine  You may need one or two doses if you smoke cigarettes or if you have certain conditions. Meningococcal conjugate (MenACWY) vaccine  You may need this if you have certain conditions. Hepatitis A vaccine  You may need this if  you have certain conditions or if you travel or work in places where you may be exposed to hepatitis A. Hepatitis B vaccine  You may need this if you have certain conditions or if you travel or work in places where you may be exposed to hepatitis B. Haemophilus influenzae type b (Hib) vaccine  You may need this if you have certain conditions. Human papillomavirus (HPV) vaccine  If recommended by your health care provider, you may need three doses over 6 months. You may receive vaccines as individual doses or as more than one vaccine together in one shot (combination vaccines). Talk with your health care  provider about the risks and benefits of combination vaccines. What tests do I need? Blood tests  Lipid and cholesterol levels. These may be checked every 5 years, or more frequently if you are over 72 years old.  Hepatitis C test.  Hepatitis B test. Screening  Lung cancer screening. You may have this screening every year starting at age 84 if you have a 30-pack-year history of smoking and currently smoke or have quit within the past 15 years.  Colorectal cancer screening. All adults should have this screening starting at age 42 and continuing until age 54. Your health care provider may recommend screening at age 54 if you are at increased risk. You will have tests every 1-10 years, depending on your results and the type of screening test.  Diabetes screening. This is done by checking your blood sugar (glucose) after you have not eaten for a while (fasting). You may have this done every 1-3 years.  Mammogram. This may be done every 1-2 years. Talk with your health care provider about when you should start having regular mammograms. This may depend on whether you have a family history of breast cancer.  BRCA-related cancer screening. This may be done if you have a family history of breast, ovarian, tubal, or peritoneal cancers.  Pelvic exam and Pap test. This may be done every 3 years starting at age 24. Starting at age 69, this may be done every 5 years if you have a Pap test in combination with an HPV test. Other tests  Sexually transmitted disease (STD) testing.  Bone density scan. This is done to screen for osteoporosis. You may have this scan if you are at high risk for osteoporosis. Follow these instructions at home: Eating and drinking  Eat a diet that includes fresh fruits and vegetables, whole grains, lean protein, and low-fat dairy.  Take vitamin and mineral supplements as recommended by your health care provider.  Do not drink alcohol if: ? Your health care provider tells  you not to drink. ? You are pregnant, may be pregnant, or are planning to become pregnant.  If you drink alcohol: ? Limit how much you have to 0-1 drink a day. ? Be aware of how much alcohol is in your drink. In the [place] one drink equals one 12 oz bottle of beer (355 mL), one 5 oz glass of wine (148 mL), or one 1 oz glass of hard liquor (44 mL). Lifestyle  Take daily care of your teeth and gums.  Stay active. Exercise for at least 30 minutes on 5 or more days each week.  Do not use any products that contain nicotine or tobacco, such as cigarettes, e-cigarettes, and chewing tobacco. If you need help quitting, ask your health care provider.  If you are sexually active, practice safe sex. Use a condom or other form of birth control (  contraception) in order to prevent pregnancy and STIs (sexually transmitted infections).  If told by your health care provider, take low-dose aspirin daily starting at age 80. What's next?  Visit your health care provider once a year for a well check visit.  Ask your health care provider how often you should have your eyes and teeth checked.  Stay up to date on all vaccines. This information is not intended to replace advice given to you by your health care provider. Make sure you discuss any questions you have with your health care provider. Document Released: 12/17/2015 Document Revised: 08/01/2018 Document Reviewed: 08/01/2018 Elsevier Patient Education  2020 [employer].

## 2019-07-03 LAB — CYTOLOGY - PAP
Diagnosis: NEGATIVE
HPV: NOT DETECTED

## 2019-07-07 ENCOUNTER — Encounter: Payer: Self-pay | Admitting: Obstetrics & Gynecology

## 2019-07-07 ENCOUNTER — Other Ambulatory Visit: Payer: Self-pay

## 2019-07-07 ENCOUNTER — Ambulatory Visit (INDEPENDENT_AMBULATORY_CARE_PROVIDER_SITE_OTHER): Payer: BC Managed Care – PPO | Admitting: Obstetrics & Gynecology

## 2019-07-07 VITALS — BP 124/82 | HR 78 | Resp 16 | Ht 65.0 in | Wt 253.0 lb

## 2019-07-07 DIAGNOSIS — N951 Menopausal and female climacteric states: Secondary | ICD-10-CM | POA: Diagnosis not present

## 2019-07-07 DIAGNOSIS — D252 Subserosal leiomyoma of uterus: Secondary | ICD-10-CM | POA: Diagnosis not present

## 2019-07-07 NOTE — Progress Notes (Signed)
Subjective:    Patient ID: Alison Fuentes, female    DOB: [birth year], 48 y.o.   MRN: [id number]  HPI  Pt has been having dropped menses over past year.  It started back in August 2019.  She would have a menstrual cycle and then go several months without one.  Her last full menstrual cycle was in February or March of this year.  Patient is denies any menopausal symptoms.  She also saw her primary care recently and thought she saw a vaginal cyst.  Patient denies discharge or pain.  Review of Systems  Constitutional: Negative.   Respiratory: Negative.   Cardiovascular: Negative.   Gastrointestinal: Negative.   Genitourinary: Negative.        Objective:   Physical Exam Vitals signs reviewed.  Constitutional:      General: She is not in acute distress.    Appearance: She is well-developed.  HENT:     Head: Normocephalic and atraumatic.  Eyes:     Conjunctiva/sclera: Conjunctivae normal.  Cardiovascular:     Rate and Rhythm: Normal rate.  Pulmonary:     Effort: Pulmonary effort is normal.  Abdominal:     Palpations: Abdomen is soft.     Tenderness: There is no abdominal tenderness.  Genitourinary:    General: Normal vulva.     Comments: Tanner V Vagina:  Pal pink and difficult to examine.  Exam easier with elevating feet and flexing knees towards head. Cervix:  Small no lesion Urethra: small amount of redness but no caruncle Uterus and adnexa are difficult to palpate secondary to habitus.   Skin:    General: Skin is warm and dry.  Neurological:     Mental Status: She is alert and oriented to person, place, and time.   EXAM: TRANSABDOMINAL AND TRANSVAGINAL ULTRASOUND OF PELVIS  TECHNIQUE: Both transabdominal and transvaginal ultrasound examinations of the pelvis were performed. Transabdominal technique was performed for global imaging of the pelvis including uterus, ovaries, adnexal regions, and pelvic cul-de-sac. It was necessary to proceed with endovaginal exam  following the transabdominal exam to visualize the endometrium.  COMPARISON:  None  FINDINGS: Uterus  Measurements: 8.9 x 4.4 x 4.6 cm = volume: 94 mL. Anteverted. Anterior wall subserosal uterine leiomyoma at mid uterus 21 x 17 x 17 mm.  Endometrium  Thickness: 2 mm.  No endometrial fluid or focal abnormality  Right ovary  Not visualized on either transabdominal or endovaginal imaging, likely obscured by bowel  Left ovary  Not visualized on either transabdominal or endovaginal imaging, likely obscured by bowel  Other findings  No free pelvic fluid.  No adnexal masses.  IMPRESSION: Small anterior wall subserosal uterine leiomyoma 21 mm greatest diameter.  Normal endometrial thickness.  Nonvisualization of ovaries.   Electronically Signed   By: Lavonia Dana M.D.   On: 02/05/2019 17:31   Assessment & Plan:  48 year old female presents for follow-up of perimenopausal oligomenorrhea and exam for vaginal cyst. 1.  No vaginal lesion appreciated on exam.  Brought into assistance for adequate exposure.  There was some redness around the urethra but no carbuncle. 2.  Bleeding pattern consistent with perimenopause.  Transvaginal ultrasound reviewed with patient including images.  Lining is 2 mm with small fibroid intramural. 3.  Patient to continue menstrual calendar.  We will have her return in 7 months where she will hopefully be 1 year without a menstrual cycle.  We reviewed indications to come in for abnormal bleeding.  25 minutes spent face-to-face with  patient with greater than 50% counseling.

## 2019-07-08 ENCOUNTER — Ambulatory Visit: Payer: BC Managed Care – PPO

## 2019-08-04 LAB — COLOGUARD

## 2019-08-07 ENCOUNTER — Telehealth: Payer: Self-pay | Admitting: Physician Assistant

## 2019-08-07 DIAGNOSIS — R195 Other fecal abnormalities: Secondary | ICD-10-CM | POA: Insufficient documentation

## 2019-08-07 NOTE — Telephone Encounter (Signed)
Please contact patient regarding Positive Cologuard She needs a colonoscopy Referral placed to [facility] message sent and details included below  Good evening [place], Your Cologuard was abnormal. The test detected altered DNA and/or blood that could be caused by a precancerous polyp or colon cancer.  False positives do occur. A false positive result occurs when Cologuard produces a positive result, even though a colonoscopy will not find cancer or precancerous polyps. [email or web address]  Any positive result should be followed up with a diagnostic colonoscopy. I have placed a referral to [facility] in [place] and they should contact you within 3 business days about scheduling an appointment.  Please let me know if you have any questions or concerns  Evlyn Clines

## 2019-08-08 NOTE — Telephone Encounter (Signed)
Pt advised of results, verbalized understanding. No further questions.

## 2019-08-12 NOTE — Addendum Note (Signed)
Addended by: Nelson Chimes E on: 08/12/2019 10:20 AM   Modules accepted: Orders

## 2019-08-13 ENCOUNTER — Encounter: Payer: Self-pay | Admitting: Physician Assistant

## 2019-08-18 ENCOUNTER — Ambulatory Visit: Payer: BC Managed Care – PPO

## 2019-09-09 ENCOUNTER — Encounter: Payer: Self-pay | Admitting: Internal Medicine

## 2019-09-11 ENCOUNTER — Encounter: Payer: Self-pay | Admitting: Internal Medicine

## 2019-09-26 ENCOUNTER — Ambulatory Visit
Admission: RE | Admit: 2019-09-26 | Discharge: 2019-09-26 | Disposition: A | Discharge status: Home / Self Care | Payer: BC Managed Care – PPO | Source: Ambulatory Visit | Attending: Physician Assistant | Admitting: Physician Assistant

## 2019-09-26 ENCOUNTER — Other Ambulatory Visit: Payer: Self-pay

## 2019-09-26 DIAGNOSIS — Z1231 Encounter for screening mammogram for malignant neoplasm of breast: Secondary | ICD-10-CM

## 2019-10-01 ENCOUNTER — Ambulatory Visit (INDEPENDENT_AMBULATORY_CARE_PROVIDER_SITE_OTHER): Payer: BC Managed Care – PPO | Admitting: Physician Assistant

## 2019-10-01 ENCOUNTER — Encounter: Payer: Self-pay | Admitting: Physician Assistant

## 2019-10-01 VITALS — Ht 65.0 in | Wt 252.0 lb

## 2019-10-01 DIAGNOSIS — R3915 Urgency of urination: Secondary | ICD-10-CM

## 2019-10-01 DIAGNOSIS — L304 Erythema intertrigo: Secondary | ICD-10-CM

## 2019-10-01 DIAGNOSIS — R32 Unspecified urinary incontinence: Secondary | ICD-10-CM

## 2019-10-01 MED ORDER — NYSTATIN 100000 UNIT/GM EX POWD: Freq: Four times a day (QID) | CUTANEOUS | 0 refills | Status: AC

## 2019-10-01 MED ORDER — CLOTRIMAZOLE-BETAMETHASONE 1-0.05 % EX CREA: 1.0000 "application " | TOPICAL_CREAM | Freq: Two times a day (BID) | CUTANEOUS | 0 refills | Status: AC

## 2019-10-01 NOTE — Progress Notes (Signed)
Patient ID: Alison Fuentes, female   DOB: [birth year], 48 y.o.   MRN: [id number] .Marland KitchenVirtual Visit via Video Note  I connected with Alison Fuentes on 10/01/19 at 10:10 AM EDT by a video enabled telemedicine application and verified that I am speaking with the correct person using two identifiers.  Location: Patient: home Provider: clinic   I discussed the limitations of evaluation and management by telemedicine and the availability of in person appointments. The patient expressed understanding and agreed to proceed.  History of Present Illness: Pt is a 48 yo female who calls into the clinic with rash of her left groin and inner thigh. She noticed it getting red a few days ago with some tiny red bumps extending from rash yesterday.seems to be getting a little more irritated with combination of itchy and burning. She used desitin last night and a lot better this morning. She admits to sweating a lot with her job. She has had similar rashes in the past.   She has also noticed some urinary urgency and leaking for last 2 days. No fever, chills, flank pain, dysuria. She has had to wear a pad. She denies any odor or blood in urine. She has not tried anything to make better. Never had anything like this before.   .. Active Ambulatory Problems    Diagnosis Date Noted  . Hypertension goal BP (blood pressure) < 130/80 01/21/2019  . Class 3 severe obesity due to excess calories with serious comorbidity and body mass index (BMI) of 40.0 to 44.9 in adult ([place]) 01/21/2019  . Postmenopausal 01/23/2019  . Fibroid uterus 06/05/2019  . Postmenopausal atrophic vaginitis 07/01/2019  . Vaginal lesion 07/01/2019  . Abnormal uterine bleeding (AUB) 07/01/2019  . Ceruminosis, left 07/01/2019  . Positive colorectal cancer screening using Cologuard test 08/07/2019  . Intertrigo 10/01/2019  . Urinary urgency 10/01/2019  . Urinary incontinence 10/01/2019   Resolved Ambulatory Problems    Diagnosis Date Noted  .  Anovulatory (dysfunctional uterine) bleeding 01/21/2019   Past Medical History:  Diagnosis Date  . Hypertension   . Irregular menses   . Obesity    Reviewed med, allergy, problem list.      Observations/Objective: No acute distress. Normal mood.  Normal breathing.  Left inguinal/groin/inner thigh erythematous rash with macular satellite lesions  .Marland Kitchen Today's Vitals   10/01/19 0928  Weight: 252 lb (114.3 kg)  Height: 5' 5"  (1.651 m)   Body mass index is 41.93 kg/m.   Assessment and Plan: Marland KitchenMarland KitchenDiagnoses and all orders for this visit:  Intertrigo -     clotrimazole-betamethasone (LOTRISONE) cream; Apply 1 application topically 2 (two) times daily. -     nystatin (NYSTATIN) powder; Apply topically 4 (four) times daily.  Urinary incontinence, unspecified type  Urinary urgency   Suspect moisture from sweating caused some yeast/fungus. Can continue desitin. Sent lotrisone and nystatin powder. Discussed keeping dry is the best solution. Wear cotton underwear.   Unclear etiology of sudden urinary symptoms without pain. Start AZO OTC for 3 days if still present or worsening symptoms come in for urine dipstick and culture. Stay hydrated.   Follow Up Instructions:    I discussed the assessment and treatment plan with the patient. The patient was provided an opportunity to ask questions and all were answered. The patient agreed with the plan and demonstrated an understanding of the instructions.   The patient was advised to call back or seek an in-person evaluation if the symptoms worsen or if the condition fails  to improve as anticipated.    Iran Planas, PA-C

## 2019-10-01 NOTE — Progress Notes (Deleted)
Couple day history LLQ skin irritation Used Desitin and helped Has gotten in the past, but this was worse  New trouble with urinary incontinence Noticed yesterday, denies painful urination/back or abdominal pain

## 2019-10-24 ENCOUNTER — Ambulatory Visit (AMBULATORY_SURGERY_CENTER): Payer: BC Managed Care – PPO | Admitting: *Deleted

## 2019-10-24 ENCOUNTER — Other Ambulatory Visit: Payer: Self-pay

## 2019-10-24 VITALS — Temp 97.3°F | Ht 65.0 in | Wt 245.0 lb

## 2019-10-24 DIAGNOSIS — R195 Other fecal abnormalities: Secondary | ICD-10-CM

## 2019-10-24 DIAGNOSIS — Z1159 Encounter for screening for other viral diseases: Secondary | ICD-10-CM

## 2019-10-24 MED ORDER — SUPREP BOWEL PREP KIT 17.5-3.13-1.6 GM/177ML PO SOLN: 1.0000 | Freq: Once | ORAL | 0 refills | Status: AC

## 2019-10-24 NOTE — Progress Notes (Signed)
No egg or soy allergy known to patient  No issues with past sedation with any surgeries  or procedures, no intubation problems  No diet pills per patient No home 02 use per patient  No blood thinners per patient  Pt denies issues with constipation  No A fib or A flutter  EMMI video sent to pt's e mail   Due to the COVID-19 pandemic we are asking patients to follow these guidelines. Please only bring one care partner. Please be aware that your care partner may wait in the car in the parking lot or if they feel like they will be too hot to wait in the car, they may wait in the lobby on the 4th floor. All care partners are required to wear a mask the entire time (we do not have any that we can provide them), they need to practice social distancing, and we will do a Covid check for all patient's and care partners when you arrive. Also we will check their temperature and your temperature. If the care partner waits in their car they need to stay in the parking lot the entire time and we will call them on their cell phone when the patient is ready for discharge so they can bring the car to the front of the building. Also all patient's will need to wear a mask into building.  cov test 12-1 at 1220 pm

## 2019-10-27 ENCOUNTER — Encounter: Payer: Self-pay | Admitting: Internal Medicine

## 2019-11-07 ENCOUNTER — Encounter: Payer: BC Managed Care – PPO | Admitting: Internal Medicine

## 2019-12-15 ENCOUNTER — Ambulatory Visit (INDEPENDENT_AMBULATORY_CARE_PROVIDER_SITE_OTHER): Payer: BC Managed Care – PPO

## 2019-12-15 ENCOUNTER — Other Ambulatory Visit: Payer: Self-pay | Admitting: Internal Medicine

## 2019-12-15 DIAGNOSIS — Z1159 Encounter for screening for other viral diseases: Secondary | ICD-10-CM

## 2019-12-16 LAB — SARS CORONAVIRUS 2 (TAT 6-24 HRS): SARS Coronavirus 2: NEGATIVE

## 2019-12-17 ENCOUNTER — Encounter: Payer: Self-pay | Admitting: Internal Medicine

## 2019-12-17 ENCOUNTER — Ambulatory Visit (AMBULATORY_SURGERY_CENTER): Payer: BC Managed Care – PPO | Admitting: Internal Medicine

## 2019-12-17 ENCOUNTER — Other Ambulatory Visit: Payer: Self-pay

## 2019-12-17 VITALS — BP 144/83 | HR 86 | Temp 96.6°F | Resp 15 | Ht 65.0 in | Wt 245.0 lb

## 2019-12-17 DIAGNOSIS — D122 Benign neoplasm of ascending colon: Secondary | ICD-10-CM

## 2019-12-17 DIAGNOSIS — R195 Other fecal abnormalities: Secondary | ICD-10-CM

## 2019-12-17 DIAGNOSIS — D129 Benign neoplasm of anus and anal canal: Secondary | ICD-10-CM

## 2019-12-17 DIAGNOSIS — D128 Benign neoplasm of rectum: Secondary | ICD-10-CM

## 2019-12-17 MED ORDER — SODIUM CHLORIDE 0.9 % IV SOLN: 500.0000 mL | Freq: Once | INTRAVENOUS | Status: DC

## 2019-12-17 NOTE — Patient Instructions (Addendum)
YOU HAD AN ENDOSCOPIC PROCEDURE TODAY AT [facility]:   Refer to the procedure report that was given to you for any specific questions about what was found during the examination.  If the procedure report does not answer your questions, please call your gastroenterologist to clarify.  If you requested that your care partner not be given the details of your procedure findings, then the procedure report has been included in a sealed envelope for you to review at your convenience later.  YOU SHOULD EXPECT: Some feelings of bloating in the abdomen. Passage of more gas than usual.  Walking can help get rid of the air that was put into your GI tract during the procedure and reduce the bloating. If you had a lower endoscopy (such as a colonoscopy or flexible sigmoidoscopy) you may notice spotting of blood in your stool or on the toilet paper. If you underwent a bowel prep for your procedure, you may not have a normal bowel movement for a few days.  Please Note:  You might notice some irritation and congestion in your nose or some drainage.  This is from the oxygen used during your procedure.  There is no need for concern and it should clear up in a day or so.  SYMPTOMS TO REPORT IMMEDIATELY:   Following lower endoscopy (colonoscopy or flexible sigmoidoscopy):  Excessive amounts of blood in the stool  Significant tenderness or worsening of abdominal pains  Swelling of the abdomen that is new, acute  Fever of 100F or higher    For urgent or emergent issues, a gastroenterologist can be reached at any hour by calling [phone].   DIET:  We do recommend a small meal at first, but then you may proceed to your regular diet.  Drink plenty of fluids but you should avoid alcoholic beverages for 24 hours.  ACTIVITY:  You should plan to take it easy for the rest of today and you should NOT DRIVE or use heavy machinery until tomorrow (because of the sedation medicines used during the test).     FOLLOW UP: Our staff will call the number listed on your records 48-72 hours following your procedure to check on you and address any questions or concerns that you may have regarding the information given to you following your procedure. If we do not reach you, we will leave a message.  We will attempt to reach you two times.  During this call, we will ask if you have developed any symptoms of COVID 19. If you develop any symptoms (ie: fever, flu-like symptoms, shortness of breath, cough etc.) before then, please call [phone].  If you test positive for Covid 19 in the 2 weeks post procedure, please call and report this information to [place].    If any biopsies were taken you will be contacted by phone or by letter within the next 1-3 weeks.  Please call us at [phone] if you have not heard about the biopsies in 3 weeks.    SIGNATURES/CONFIDENTIALITY: You and/or your care partner have signed paperwork which will be entered into your electronic medical record.  These signatures attest to the fact that that the information above on your After Visit Summary has been reviewed and is understood.  Full responsibility of the confidentiality of this discharge information lies with you and/or your care-partner.    Handout was given to you on polyps.  You may resume your current medications today. Await biopsy results. Please call if any questions  or concerns.

## 2019-12-17 NOTE — Progress Notes (Signed)
No problems noted in the recovery room. maw 

## 2019-12-17 NOTE — Progress Notes (Signed)
Called to room to assist during endoscopic procedure.  Patient ID and intended procedure confirmed with present staff. Received instructions for my participation in the procedure from the performing physician.  

## 2019-12-17 NOTE — Progress Notes (Signed)
Temp JB VS DT  Pt's states no medical or surgical changes since previsit or office visit.

## 2019-12-17 NOTE — Progress Notes (Signed)
IV infiltrated at 10:15 with first dose of propofol. New IV began 22g left hand x 1 attempt. Pt tolerated well. and IV on left inner forearm discontinued.

## 2019-12-17 NOTE — Progress Notes (Signed)
Pt Drowsy. VSS. To PACU, report to RN. No anesthetic complications noted.

## 2019-12-17 NOTE — Op Note (Signed)
[facility] Patient Name: Alison Fuentes Procedure Date: 12/17/2019 9:59 AM MRN: [id number] Endoscopist: Docia Chuck. Henrene Pastor , MD Age: 49 Referring MD:  Date of Birth: Aug 17, 1971 Gender: Female Account #: [id number] Procedure:                Colonoscopy with cold polypectomy x 2 Indications:              Positive Cologuard test Medicines:                Monitored Anesthesia Care Procedure:                Pre-Anesthesia Assessment:                           - Prior to the procedure, a History and Physical                            was performed, and patient medications and                            allergies were reviewed. The patient's tolerance of                            previous anesthesia was also reviewed. The risks                            and benefits of the procedure and the sedation                            options and risks were discussed with the patient.                            All questions were answered, and informed consent                            was obtained. Prior Anticoagulants: The patient has                            taken no previous anticoagulant or antiplatelet                            agents. ASA Grade Assessment: II - A patient with                            mild systemic disease. After reviewing the risks                            and benefits, the patient was deemed in                            satisfactory condition to undergo the procedure.                           After obtaining informed consent, the colonoscope  was passed under direct vision. Throughout the                            procedure, the patient's blood pressure, pulse, and                            oxygen saturations were monitored continuously. The                            Colonoscope was introduced through the anus and                            advanced to the the cecum, identified by                            appendiceal orifice and  ileocecal valve. The                            ileocecal valve, appendiceal orifice, and rectum                            were photographed. The quality of the bowel                            preparation was excellent. The colonoscopy was                            performed without difficulty. The patient tolerated                            the procedure well. The bowel preparation used was                            SUPREP via split dose instruction. Scope In: 10:20:24 AM Scope Out: 10:33:51 AM Scope Withdrawal Time: 0 hours 11 minutes 4 seconds  Total Procedure Duration: 0 hours 13 minutes 27 seconds  Findings:                 Two polyps were found in the rectum. The polyps                            were 4 to 5 mm in size. These polyps were removed                            with a cold snare. Resection and retrieval were                            complete.                           The exam was otherwise without abnormality on                            direct and retroflexion views. Complications:  No immediate complications. Estimated blood loss:                            None. Estimated Blood Loss:     Estimated blood loss: none. Impression:               - Two 4 to 5 mm polyps in the rectum, removed with                            a cold snare. Resected and retrieved.                           - The examination was otherwise normal on direct                            and retroflexion views. Recommendation:           - Repeat colonoscopy in 7 years for surveillance.                           - Patient has a contact number available for                            emergencies. The signs and symptoms of potential                            delayed complications were discussed with the                            patient. Return to normal activities tomorrow.                            Written discharge instructions were provided to the                             patient.                           - Resume previous diet.                           - Continue present medications.                           - Await pathology results. Docia Chuck. Henrene Pastor, MD 12/17/2019 10:37:23 AM This report has been signed electronically.

## 2019-12-19 ENCOUNTER — Encounter: Payer: Self-pay | Admitting: Internal Medicine

## 2019-12-19 ENCOUNTER — Telehealth: Payer: Self-pay

## 2019-12-19 NOTE — Telephone Encounter (Signed)
  Follow up Call-  Call back number 12/17/2019  Post procedure Call Back phone  # [phone]  Permission to leave phone message Yes  Some recent data might be hidden     Patient questions:  Do you have a fever, pain , or abdominal swelling? No. Pain Score  0 *  Have you tolerated food without any problems? Yes.    Have you been able to return to your normal activities? Yes.    Do you have any questions about your discharge instructions: Diet   No. Medications  No. Follow up visit  No.  Do you have questions or concerns about your Care? No.  Actions: * If pain score is 4 or above: 1. No action needed, pain <4.Have you developed a fever since your procedure? no  2.   Have you had an respiratory symptoms (SOB or cough) since your procedure? no  3.   Have you tested positive for COVID 19 since your procedure no  4.   Have you had any family members/close contacts diagnosed with the COVID 19 since your procedure?  no   If yes to any of these questions please route to Joylene John, RN and Alphonsa Gin, Therapist, sports.

## 2019-12-22 ENCOUNTER — Telehealth: Payer: Self-pay

## 2019-12-22 NOTE — Telephone Encounter (Signed)
Appointment has been switched and taken care of. No further questions at this time.

## 2019-12-22 NOTE — Telephone Encounter (Signed)
Pt left a vm msg stating she has made several calls to switch her PCP and has not been given a call back. Pt mentioned she has an upcoming appt on 01/01/20 with Dr. Sheppard Coil. She would like to establish care with Dr. Zigmund Daniel. Pls contact pt for scheduling. Thanks.

## 2019-12-25 ENCOUNTER — Other Ambulatory Visit: Payer: Self-pay | Admitting: Physician Assistant

## 2019-12-25 DIAGNOSIS — I10 Essential (primary) hypertension: Secondary | ICD-10-CM

## 2020-01-01 ENCOUNTER — Ambulatory Visit: Payer: BC Managed Care – PPO | Admitting: Physician Assistant

## 2020-01-01 ENCOUNTER — Ambulatory Visit: Payer: BC Managed Care – PPO | Admitting: Osteopathic Medicine

## 2020-01-05 ENCOUNTER — Ambulatory Visit: Payer: BC Managed Care – PPO | Admitting: Family Medicine

## 2020-01-05 ENCOUNTER — Encounter: Payer: Self-pay | Admitting: Family Medicine

## 2020-01-05 ENCOUNTER — Ambulatory Visit (INDEPENDENT_AMBULATORY_CARE_PROVIDER_SITE_OTHER): Payer: BC Managed Care – PPO | Admitting: Family Medicine

## 2020-01-05 VITALS — BP 125/81 | HR 83 | Wt 246.0 lb

## 2020-01-05 DIAGNOSIS — I10 Essential (primary) hypertension: Secondary | ICD-10-CM

## 2020-01-05 DIAGNOSIS — Z13220 Encounter for screening for lipoid disorders: Secondary | ICD-10-CM | POA: Diagnosis not present

## 2020-01-05 NOTE — Progress Notes (Signed)
Alison Fuentes - 49 y.o. female MRN [id number]  Date of birth: 1971/11/17  Subjective Chief Complaint  Patient presents with  . Hypertension    HPI Alison Fuentes is a 49 y.o. female with history of HTN here today for follow up.  She is doing well with current treatment of losartan.  BP well controlled in clinic and at home.  She denies side effects from medication.  She has not had any symptoms related to hypertension including chest pain, shortness of breath, headache, palpitations or edema.   ROS:  A comprehensive ROS was completed and negative except as noted per HPI  Allergies  Allergen Reactions  . Codeine Nausea And Vomiting    Past Medical History:  Diagnosis Date  . Hypertension   . Irregular menses   . Obesity     Past Surgical History:  Procedure Laterality Date  . OVARIAN CYST REMOVAL    . WISDOM TOOTH EXTRACTION      Social History   Socioeconomic History  . Marital status: Married    Spouse name: Not on file  . Number of children: Not on file  . Years of education: Not on file  . Highest education level: Not on file  Occupational History  . Not on file  Tobacco Use  . Smoking status: Never Smoker  . Smokeless tobacco: Never Used  Substance and Sexual Activity  . Alcohol use: Not Currently  . Drug use: Never  . Sexual activity: Not Currently    Birth control/protection: None  Other Topics Concern  . Not on file  Social History Narrative  . Not on file   Social Determinants of Health   Financial Resource Strain:   . Difficulty of Paying Living Expenses: Not on file  Food Insecurity:   . Worried About Charity fundraiser in the Last Year: Not on file  . Ran Out of Food in the Last Year: Not on file  Transportation Needs:   . Lack of Transportation (Medical): Not on file  . Lack of Transportation (Non-Medical): Not on file  Physical Activity:   . Days of Exercise per Week: Not on file  . Minutes of Exercise per Session: Not on file  Stress:    . Feeling of Stress : Not on file  Social Connections:   . Frequency of Communication with Friends and Family: Not on file  . Frequency of Social Gatherings with Friends and Family: Not on file  . Attends Religious Services: Not on file  . Active Member of Clubs or Organizations: Not on file  . Attends Archivist Meetings: Not on file  . Marital Status: Not on file    Family History  Problem Relation Age of Onset  . Hyperlipidemia Mother   . Fibromyalgia Mother   . Atrial fibrillation Father   . Colon polyps Father   . Hyperlipidemia Maternal Aunt   . Heart disease Maternal Grandmother   . Valvular heart disease Maternal Grandmother   . Coronary artery disease Maternal Grandmother   . Heart disease Maternal Uncle   . Colon cancer Other   . Breast cancer Neg Hx   . Esophageal cancer Neg Hx   . Rectal cancer Neg Hx   . Stomach cancer Neg Hx     Health Maintenance  Topic Date Due  . TETANUS/TDAP  01/22/2020 (Originally 11/04/1988)  . MAMMOGRAM  09/25/2021  . PAP SMEAR-Modifier  06/30/2024  . COLONOSCOPY  12/16/2026  . INFLUENZA VACCINE  Completed    -----------------------------------------------------------------------------------------------------------------------------------------------------------------------------------------------------------------  Physical Exam BP 125/81   Pulse 83   Wt 246 lb (111.6 kg)   LMP 05/12/2019   SpO2 98%   BMI 40.94 kg/m   Physical Exam Constitutional:      Appearance: Normal appearance.  HENT:     Head: Normocephalic and atraumatic.  Eyes:     General: No scleral icterus. Cardiovascular:     Rate and Rhythm: Normal rate and regular rhythm.  Pulmonary:     Effort: Pulmonary effort is normal.     Breath sounds: Normal breath sounds.  Abdominal:     General: Abdomen is flat.  Musculoskeletal:     Cervical back: Neck supple.  Skin:    General: Skin is warm and dry.  Neurological:     General: No focal deficit  present.     Mental Status: She is alert.  Psychiatric:        Mood and Affect: Mood normal.        Behavior: Behavior normal.     ------------------------------------------------------------------------------------------------------------------------------------------------------------------------------------------------------------------- Assessment and Plan  Hypertension goal BP (blood pressure) < 130/80 Blood pressure is at goal at for age and co-morbidities.  I recommend she continue current medication.  In addition they were instructed to follow a low sodium diet with regular exercise to help to maintain adequate control of blood pressure.      This visit occurred during the SARS-CoV-2 public health emergency.  Safety protocols were in place, including screening questions prior to the visit, additional usage of staff PPE, and extensive cleaning of exam room while observing appropriate contact time as indicated for disinfecting solutions.

## 2020-01-05 NOTE — Assessment & Plan Note (Signed)
Blood pressure is at goal at for age and co-morbidities.  I recommend she continue current medication.  In addition they were instructed to follow a low sodium diet with regular exercise to help to maintain adequate control of blood pressure.

## 2020-01-05 NOTE — Patient Instructions (Addendum)
Nice to meet you today.  Continue your current medications and see me again in 6 months.  Don't forget to have labs completed.

## 2020-02-13 ENCOUNTER — Ambulatory Visit: Payer: BC Managed Care – PPO | Attending: Internal Medicine

## 2020-02-13 DIAGNOSIS — Z23 Encounter for immunization: Secondary | ICD-10-CM

## 2020-02-13 NOTE — Progress Notes (Signed)
   Covid-19 Vaccination Clinic  Name:  Alison Fuentes    MRN: [id number] DOB: 19-Oct-1971  02/13/2020  Ms. Wilbanks was observed post Covid-19 immunization for 15 minutes without incident. She was provided with Vaccine Information Sheet and instruction to access the V-Safe system.   Ms. Waltz was instructed to call 911 with any severe reactions post vaccine: Marland Kitchen Difficulty breathing  . Swelling of face and throat  . A fast heartbeat  . A bad rash all over body  . Dizziness and weakness   Immunizations Administered    Name Date Dose VIS Date Route   Pfizer COVID-19 Vaccine 02/13/2020  2:48 PM 0.3 mL 11/14/2019 Intramuscular   Manufacturer: [place]   Lot: [id number]   [place]: [id number]

## 2020-03-09 ENCOUNTER — Ambulatory Visit: Payer: BC Managed Care – PPO | Attending: Internal Medicine

## 2020-03-09 DIAGNOSIS — Z23 Encounter for immunization: Secondary | ICD-10-CM

## 2020-03-09 NOTE — Progress Notes (Signed)
   Covid-19 Vaccination Clinic  Name:  Alison Fuentes    MRN: [id number] DOB: [birth year]  03/09/2020  [name] was observed post Covid-19 immunization for 15 minutes without incident. She was provided with Vaccine Information Sheet and instruction to access the V-Safe system.   [name] was instructed to call 911 with any severe reactions post vaccine: Marland Kitchen Difficulty breathing  . Swelling of face and throat  . A fast heartbeat  . A bad rash all over body  . Dizziness and weakness   Immunizations Administered    Name Date Dose VIS Date Route   Pfizer COVID-19 Vaccine 03/09/2020  2:30 PM 0.3 mL 11/14/2019 Intramuscular   Manufacturer: [place]   Lot: [id number]   [place]: [address]-6

## 2020-06-16 ENCOUNTER — Other Ambulatory Visit: Payer: Self-pay | Admitting: Osteopathic Medicine

## 2020-06-16 DIAGNOSIS — I10 Essential (primary) hypertension: Secondary | ICD-10-CM

## 2020-07-05 ENCOUNTER — Other Ambulatory Visit: Payer: Self-pay

## 2020-07-05 ENCOUNTER — Encounter: Payer: Self-pay | Admitting: Family Medicine

## 2020-07-05 ENCOUNTER — Ambulatory Visit (INDEPENDENT_AMBULATORY_CARE_PROVIDER_SITE_OTHER): Payer: BC Managed Care – PPO | Admitting: Family Medicine

## 2020-07-05 VITALS — BP 125/85 | HR 49 | Ht 64.96 in | Wt 245.0 lb

## 2020-07-05 DIAGNOSIS — I10 Essential (primary) hypertension: Secondary | ICD-10-CM

## 2020-07-05 DIAGNOSIS — Z13220 Encounter for screening for lipoid disorders: Secondary | ICD-10-CM | POA: Diagnosis not present

## 2020-07-05 NOTE — Patient Instructions (Signed)

## 2020-07-05 NOTE — Assessment & Plan Note (Addendum)
Blood pressure is at goal at for age and co-morbidities.  I recommend she continue losartan at current strength.  In addition they were instructed to follow a low sodium diet with regular exercise to help to maintain adequate control of blood pressure.   Reminded to have updated labs.

## 2020-07-05 NOTE — Progress Notes (Signed)
Alison Fuentes - 49 y.o. female MRN [id number]  Date of birth: 27-May-1971  Subjective Chief Complaint  Patient presents with  . Follow-up    HPI Alison Fuentes is a 49 y.o. female here today for follow up of HTN.  Current management with losartan 158m.  She is compliant with medication and denies side effects.  She is without symptoms related to HTN including chest pain, shortness of breath, palpitations, headache or vision changes.   ROS:  A comprehensive ROS was completed and negative except as noted per HPI  Allergies  Allergen Reactions  . Codeine Nausea And Vomiting    Past Medical History:  Diagnosis Date  . Hypertension   . Irregular menses   . Obesity     Past Surgical History:  Procedure Laterality Date  . OVARIAN CYST REMOVAL    . WISDOM TOOTH EXTRACTION      Social History   Socioeconomic History  . Marital status: Married    Spouse name: Not on file  . Number of children: Not on file  . Years of education: Not on file  . Highest education level: Not on file  Occupational History  . Not on file  Tobacco Use  . Smoking status: Never Smoker  . Smokeless tobacco: Never Used  Vaping Use  . Vaping Use: Never used  Substance and Sexual Activity  . Alcohol use: Not Currently  . Drug use: Never  . Sexual activity: Not Currently    Birth control/protection: None  Other Topics Concern  . Not on file  Social History Narrative  . Not on file   Social Determinants of Health   Financial Resource Strain:   . Difficulty of Paying Living Expenses:   Food Insecurity:   . Worried About RCharity fundraiserin the Last Year:   . RArboriculturistin the Last Year:   Transportation Needs:   . LFilm/video editor(Medical):   .Marland KitchenLack of Transportation (Non-Medical):   Physical Activity:   . Days of Exercise per Week:   . Minutes of Exercise per Session:   Stress:   . Feeling of Stress :   Social Connections:   . Frequency of Communication with Friends and  Family:   . Frequency of Social Gatherings with Friends and Family:   . Attends Religious Services:   . Active Member of Clubs or Organizations:   . Attends CArchivistMeetings:   .Marland KitchenMarital Status:     Family History  Problem Relation Age of Onset  . Hyperlipidemia Mother   . Fibromyalgia Mother   . Atrial fibrillation Father   . Colon polyps Father   . Hyperlipidemia Maternal Aunt   . Heart disease Maternal Grandmother   . Valvular heart disease Maternal Grandmother   . Coronary artery disease Maternal Grandmother   . Heart disease Maternal Uncle   . Colon cancer Other   . Breast cancer Neg Hx   . Esophageal cancer Neg Hx   . Rectal cancer Neg Hx   . Stomach cancer Neg Hx     Health Maintenance  Topic Date Due  . Hepatitis C Screening  Never done  . TETANUS/TDAP  Never done  . INFLUENZA VACCINE  07/04/2020  . MAMMOGRAM  09/25/2021  . PAP SMEAR-Modifier  06/30/2024  . COLONOSCOPY  12/16/2026  . COVID-19 Vaccine  Completed     ----------------------------------------------------------------------------------------------------------------------------------------------------------------------------------------------------------------- Physical Exam BP 125/85   Pulse (!) 49   Ht 5' 4.96" (1.65  m)   Wt 245 lb (111.1 kg)   LMP 05/12/2019   SpO2 100%   BMI 40.82 kg/m   Physical Exam Constitutional:      Appearance: Normal appearance.  Eyes:     General: No scleral icterus. Cardiovascular:     Rate and Rhythm: Normal rate and regular rhythm.  Pulmonary:     Effort: Pulmonary effort is normal.     Breath sounds: Normal breath sounds.  Musculoskeletal:     Cervical back: Neck supple.  Skin:    General: Skin is warm and dry.  Neurological:     General: No focal deficit present.     Mental Status: She is alert.  Psychiatric:        Mood and Affect: Mood normal.        Behavior: Behavior normal.      ------------------------------------------------------------------------------------------------------------------------------------------------------------------------------------------------------------------- Assessment and Plan  Hypertension goal BP (blood pressure) < 130/80 Blood pressure is at goal at for age and co-morbidities.  I recommend she continue losartan at current strength.  In addition they were instructed to follow a low sodium diet with regular exercise to help to maintain adequate control of blood pressure.   Reminded to have updated labs.    No orders of the defined types were placed in this encounter.   No follow-ups on file.    This visit occurred during the SARS-CoV-2 public health emergency.  Safety protocols were in place, including screening questions prior to the visit, additional usage of staff PPE, and extensive cleaning of exam room while observing appropriate contact time as indicated for disinfecting solutions.

## 2020-07-06 LAB — COMPLETE METABOLIC PANEL WITH GFR
AG Ratio: 1.5 (calc) (ref 1.0–2.5)
ALT: 12 U/L (ref 6–29)
AST: 16 U/L (ref 10–35)
Albumin: 4.3 g/dL (ref 3.6–5.1)
Alkaline phosphatase (APISO): 67 U/L (ref 37–153)
BUN: 13 mg/dL (ref 7–25)
CO2: 24 mmol/L (ref 20–32)
Calcium: 9.7 mg/dL (ref 8.6–10.4)
Chloride: 101 mmol/L (ref 98–110)
Creat: 0.6 mg/dL (ref 0.50–1.05)
GFR, Est African American: 123 mL/min/{1.73_m2} (ref >=60)
GFR, Est Non African American: 106 mL/min/{1.73_m2} (ref >=60)
Globulin: 2.9 g/dL (calc) (ref 1.9–3.7)
Glucose, Bld: 83 mg/dL (ref 65–99)
Potassium: 3.6 mmol/L (ref 3.5–5.3)
Sodium: 142 mmol/L (ref 135–146)
Total Bilirubin: 0.6 mg/dL (ref 0.2–1.2)
Total Protein: 7.2 g/dL (ref 6.1–8.1)

## 2020-07-06 LAB — CBC
HCT: 44.2 % (ref 35.0–45.0)
Hemoglobin: 14.1 g/dL (ref 11.7–15.5)
MCH: 28 pg (ref 27.0–33.0)
MCHC: 31.9 g/dL — ABNORMAL LOW (ref 32.0–36.0)
MCV: 87.9 fL (ref 80.0–100.0)
MPV: 10.7 fL (ref 7.5–12.5)
Platelets: 292 10*3/uL (ref 140–400)
RBC: 5.03 10*6/uL (ref 3.80–5.10)
RDW: 13.3 % (ref 11.0–15.0)
WBC: 6.1 10*3/uL (ref 3.8–10.8)

## 2020-07-06 LAB — LIPID PANEL
Cholesterol: 205 mg/dL — ABNORMAL HIGH (ref <200)
HDL: 43 mg/dL — ABNORMAL LOW (ref >=50)
LDL Cholesterol (Calc): 141 mg/dL (calc) — ABNORMAL HIGH
Non-HDL Cholesterol (Calc): 162 mg/dL (calc) — ABNORMAL HIGH (ref <130)
Total CHOL/HDL Ratio: 4.8 (calc) (ref <5.0)
Triglycerides: 98 mg/dL (ref <150)

## 2020-07-06 LAB — SPECIMEN COMPROMISED

## 2020-07-06 LAB — TSH: TSH: 2.05 mIU/L

## 2020-08-24 ENCOUNTER — Other Ambulatory Visit: Payer: Self-pay | Admitting: Family Medicine

## 2020-08-24 DIAGNOSIS — Z1231 Encounter for screening mammogram for malignant neoplasm of breast: Secondary | ICD-10-CM

## 2020-09-27 ENCOUNTER — Ambulatory Visit
Admission: RE | Admit: 2020-09-27 | Discharge: 2020-09-27 | Disposition: A | Discharge status: Home / Self Care | Payer: BC Managed Care – PPO | Source: Ambulatory Visit | Attending: Family Medicine | Admitting: Family Medicine

## 2020-09-27 ENCOUNTER — Other Ambulatory Visit: Payer: Self-pay

## 2020-09-27 ENCOUNTER — Ambulatory Visit: Payer: BC Managed Care – PPO

## 2020-09-27 DIAGNOSIS — Z1231 Encounter for screening mammogram for malignant neoplasm of breast: Secondary | ICD-10-CM

## 2020-12-16 ENCOUNTER — Other Ambulatory Visit: Payer: Self-pay | Admitting: Osteopathic Medicine

## 2020-12-16 DIAGNOSIS — I10 Essential (primary) hypertension: Secondary | ICD-10-CM

## 2021-01-05 ENCOUNTER — Ambulatory Visit: Payer: BC Managed Care – PPO | Admitting: Family Medicine

## 2021-03-21 ENCOUNTER — Other Ambulatory Visit: Payer: Self-pay | Admitting: Family Medicine

## 2021-03-21 DIAGNOSIS — I10 Essential (primary) hypertension: Secondary | ICD-10-CM

## 2021-06-15 ENCOUNTER — Other Ambulatory Visit: Payer: Self-pay | Admitting: Family Medicine

## 2021-06-15 DIAGNOSIS — I10 Essential (primary) hypertension: Secondary | ICD-10-CM

## 2021-06-15 NOTE — Telephone Encounter (Signed)
Please contact patient to schedule appointment. Last seen 2021. Thanks

## 2021-06-16 NOTE — Telephone Encounter (Signed)
Left voicemail message for patient to call back to get this appt scheduled. AM

## 2021-07-13 ENCOUNTER — Telehealth: Payer: Self-pay | Admitting: Family Medicine

## 2021-07-13 DIAGNOSIS — I10 Essential (primary) hypertension: Secondary | ICD-10-CM

## 2021-07-13 NOTE — Telephone Encounter (Signed)
Please contact patient for hypertension appt.

## 2021-07-14 NOTE — Telephone Encounter (Signed)
Left voicemail for patient to call us back to make this appointment.

## 2021-07-16 ENCOUNTER — Other Ambulatory Visit: Payer: Self-pay | Admitting: Family Medicine

## 2021-07-16 DIAGNOSIS — I10 Essential (primary) hypertension: Secondary | ICD-10-CM

## 2021-07-18 NOTE — Telephone Encounter (Signed)
I called pt and left a VM that patient needs to call back and schedule appt with Dr.Metheney for a f/u

## 2021-07-18 NOTE — Telephone Encounter (Signed)
Please contact the patient to schedule an appointment concerning hypertension.   Thanks  Sending 7 days of meds. No additional refills without appointment.

## 2021-08-02 ENCOUNTER — Other Ambulatory Visit: Payer: Self-pay | Admitting: Family Medicine

## 2021-08-02 DIAGNOSIS — I10 Essential (primary) hypertension: Secondary | ICD-10-CM

## 2021-09-16 ENCOUNTER — Other Ambulatory Visit: Payer: Self-pay | Admitting: Family Medicine

## 2021-09-16 DIAGNOSIS — Z1231 Encounter for screening mammogram for malignant neoplasm of breast: Secondary | ICD-10-CM

## 2021-10-13 ENCOUNTER — Ambulatory Visit
Admission: RE | Admit: 2021-10-13 | Discharge: 2021-10-13 | Disposition: A | Discharge status: Home / Self Care | Payer: BC Managed Care – PPO | Source: Ambulatory Visit

## 2021-10-13 ENCOUNTER — Other Ambulatory Visit: Payer: Self-pay

## 2021-10-13 DIAGNOSIS — Z1231 Encounter for screening mammogram for malignant neoplasm of breast: Secondary | ICD-10-CM

## 2021-12-20 IMAGING — MG MM DIGITAL SCREENING BILAT W/ TOMO AND CAD
8 series · 8 of 24 positions shown · non-contrast
Comparison: Previous exam(s).

CLINICAL DATA: Screening.

EXAM:
DIGITAL SCREENING BILATERAL MAMMOGRAM WITH TOMOSYNTHESIS AND CAD
TECHNIQUE: Bilateral screening digital craniocaudal and mediolateral oblique
mammograms were obtained. Bilateral screening digital breast
tomosynthesis was performed. The images were evaluated with
computer-aided detection.

[L CC synth-2D]
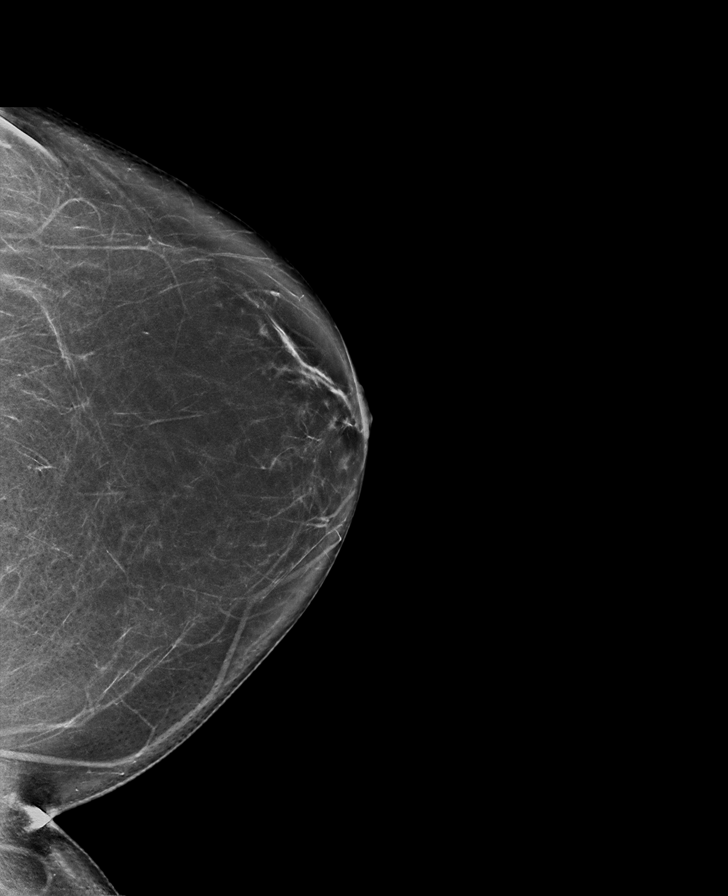

[L MLO synth-2D]
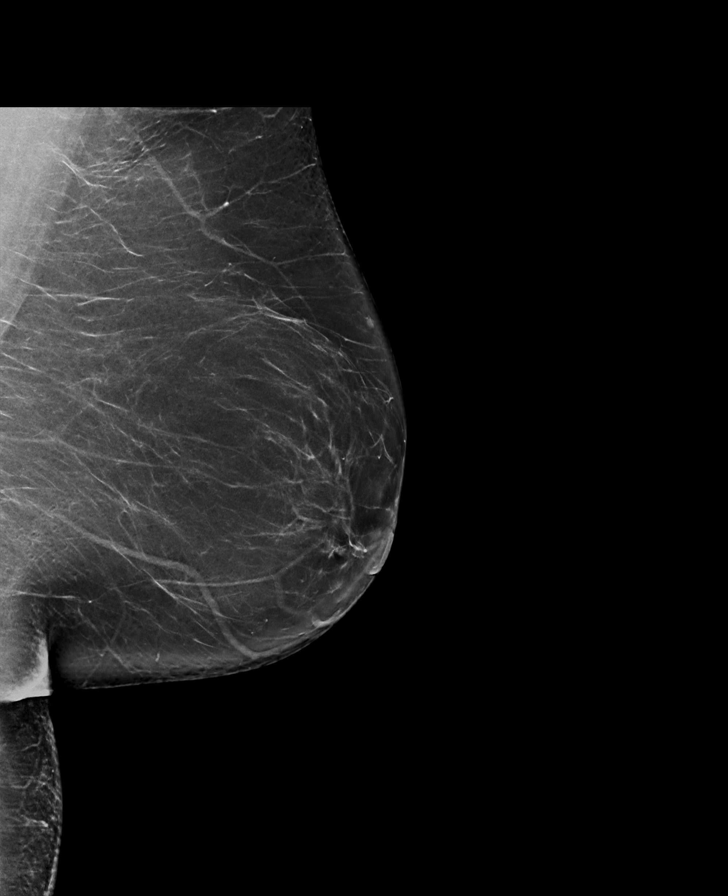

[R MLO synth-2D]
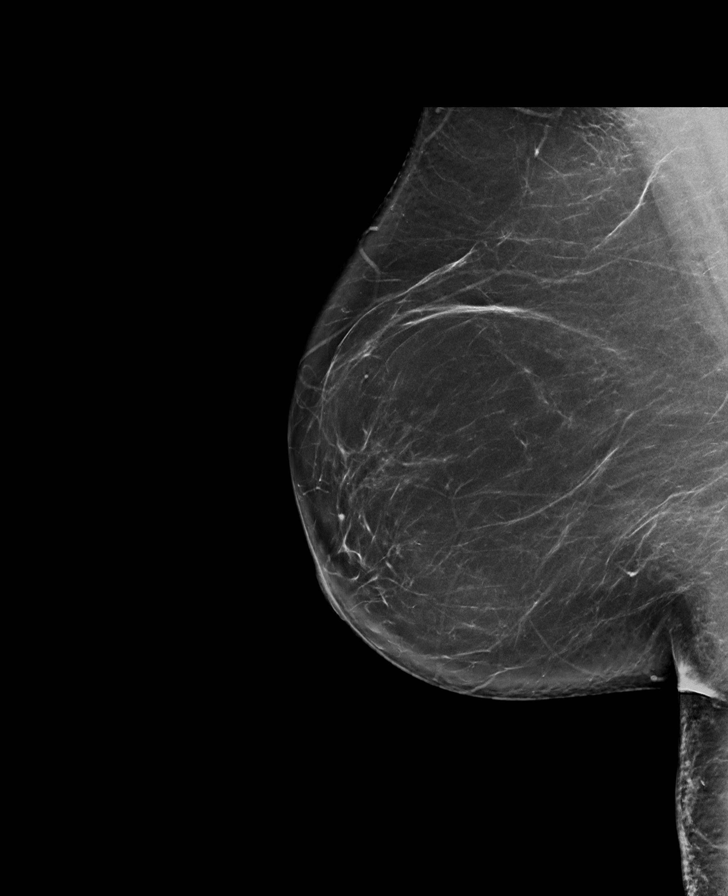

[R CC synth-2D]
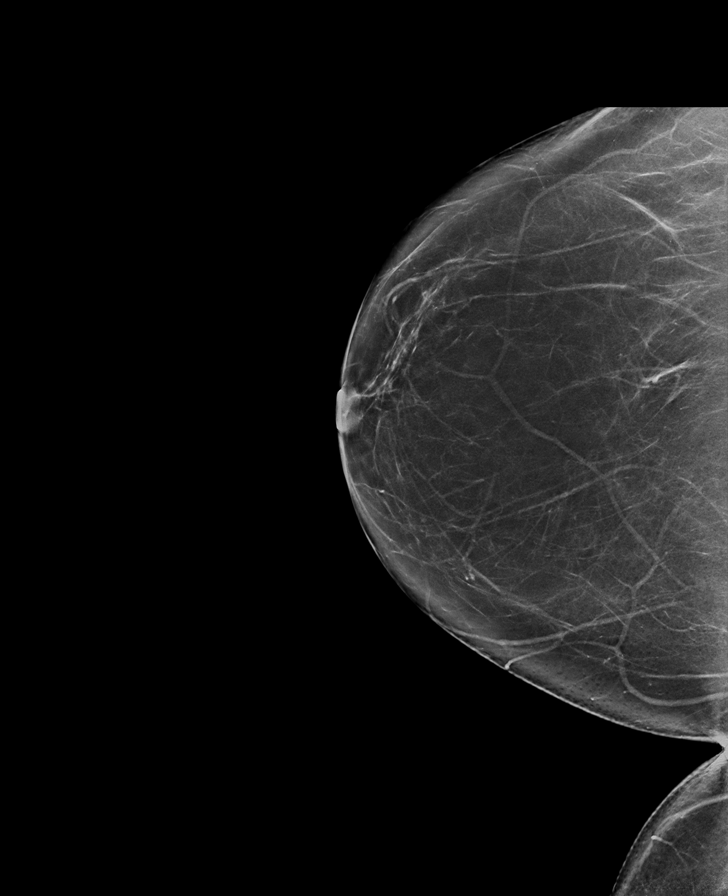

[L MLO tomo · tomo slice 43/86.0]
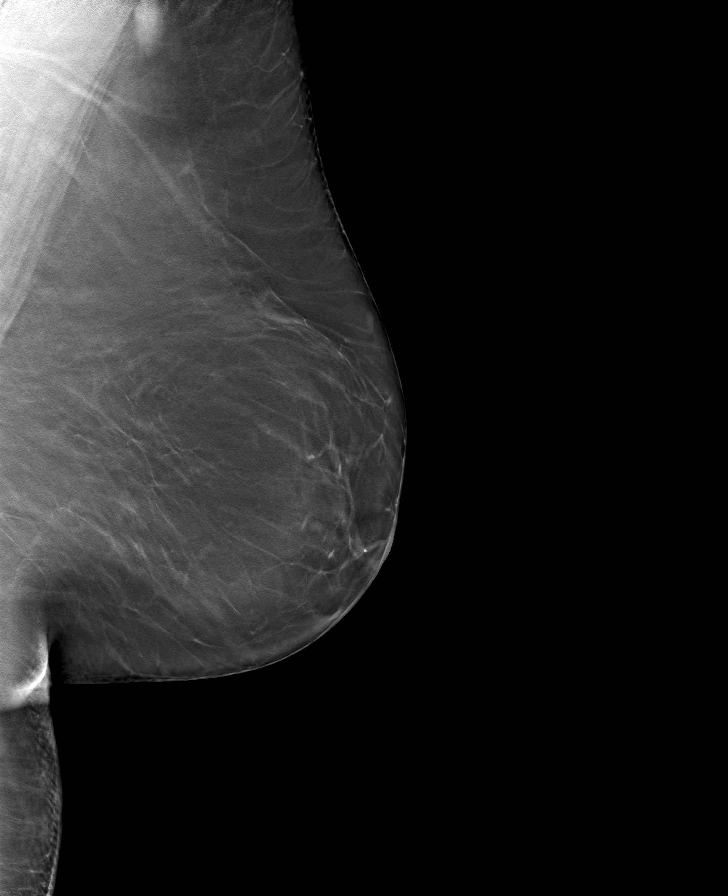

[R CC tomo · tomo slice 41/81.0]
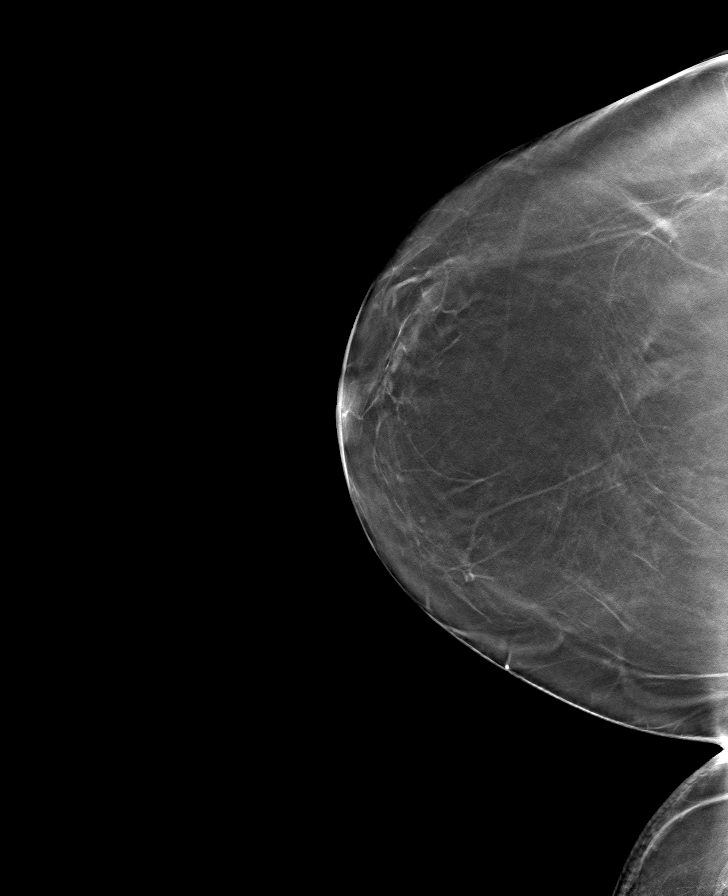

[R MLO tomo · tomo slice 44/87.0]
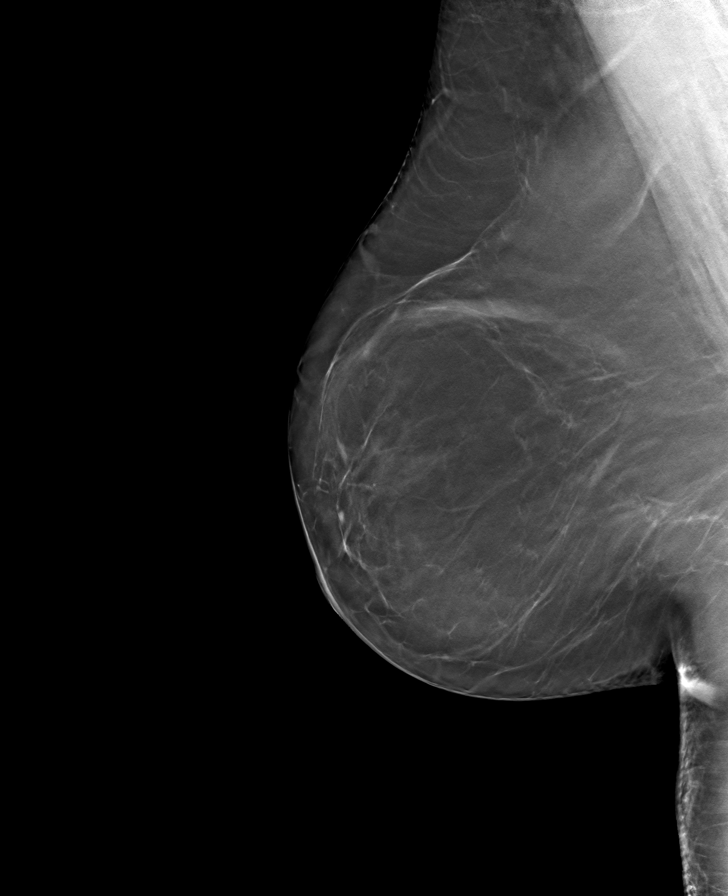

[L CC tomo · tomo slice 39/78.0]
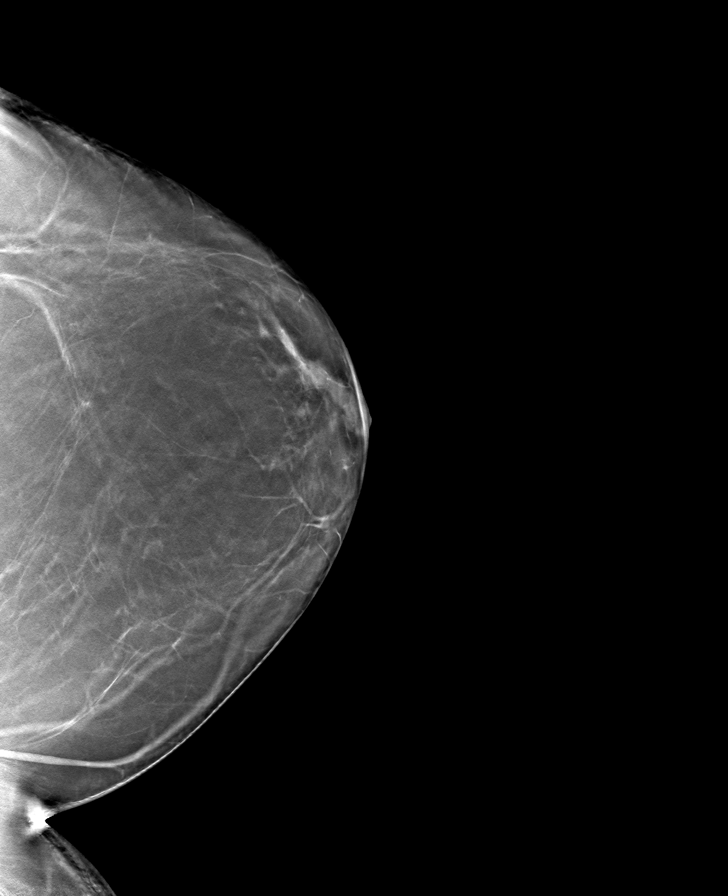

[8 of 24 positions shown; findings below may reference images not displayed]

ACR Breast Density Category b: There are scattered areas of
fibroglandular density.
FINDINGS: There are no findings suspicious for malignancy.
IMPRESSION: No mammographic evidence of malignancy. A result letter of this
screening mammogram will be mailed directly to the patient.

RECOMMENDATION:
Screening mammogram in one year. (Code:51-O-LD2)

BI-RADS CATEGORY  1: Negative.

## 2022-09-07 ENCOUNTER — Other Ambulatory Visit: Payer: Self-pay | Admitting: Family Medicine

## 2022-09-07 DIAGNOSIS — Z1231 Encounter for screening mammogram for malignant neoplasm of breast: Secondary | ICD-10-CM

## 2022-10-19 DIAGNOSIS — Z1231 Encounter for screening mammogram for malignant neoplasm of breast: Secondary | ICD-10-CM

## 2022-10-24 ENCOUNTER — Ambulatory Visit
Admission: RE | Admit: 2022-10-24 | Discharge: 2022-10-24 | Disposition: A | Discharge status: Home / Self Care | Payer: BC Managed Care – PPO | Source: Ambulatory Visit | Attending: Family Medicine | Admitting: Family Medicine

## 2022-10-24 DIAGNOSIS — Z1231 Encounter for screening mammogram for malignant neoplasm of breast: Secondary | ICD-10-CM

## 2022-12-15 DIAGNOSIS — R0981 Nasal congestion: Secondary | ICD-10-CM | POA: Diagnosis not present

## 2022-12-15 DIAGNOSIS — B349 Viral infection, unspecified: Secondary | ICD-10-CM | POA: Diagnosis not present

## 2022-12-15 DIAGNOSIS — J029 Acute pharyngitis, unspecified: Secondary | ICD-10-CM | POA: Diagnosis not present

## 2022-12-15 DIAGNOSIS — R051 Acute cough: Secondary | ICD-10-CM | POA: Diagnosis not present

## 2023-11-21 ENCOUNTER — Other Ambulatory Visit: Payer: Self-pay | Admitting: Family Medicine

## 2023-11-21 DIAGNOSIS — Z1231 Encounter for screening mammogram for malignant neoplasm of breast: Secondary | ICD-10-CM

## 2023-12-14 ENCOUNTER — Ambulatory Visit: Payer: BC Managed Care – PPO

## 2024-01-01 ENCOUNTER — Ambulatory Visit
Admission: RE | Admit: 2024-01-01 | Discharge: 2024-01-01 | Disposition: A | Discharge status: Home / Self Care | Payer: 59 | Source: Ambulatory Visit | Attending: Family Medicine | Admitting: Family Medicine

## 2024-01-01 DIAGNOSIS — Z1231 Encounter for screening mammogram for malignant neoplasm of breast: Secondary | ICD-10-CM
